# Patient Record
Sex: Female | Born: 1970 | Race: Black or African American | Hispanic: No | Marital: Single | Smoking: Never smoker
Health system: Southern US, Community
[De-identification: ages and names within clinical notes are randomized; demographics above are authoritative.]

## PROBLEM LIST (undated history)

## (undated) DIAGNOSIS — J302 Other seasonal allergic rhinitis: Secondary | ICD-10-CM

## (undated) DIAGNOSIS — J45909 Unspecified asthma, uncomplicated: Secondary | ICD-10-CM

## (undated) HISTORY — DX: Other seasonal allergic rhinitis: J30.2

---

## 1997-11-23 ENCOUNTER — Emergency Department (HOSPITAL_COMMUNITY): Admission: EM | Admit: 1997-11-23 | Discharge: 1997-11-23 | Payer: Self-pay | Admitting: Emergency Medicine

## 1998-07-08 ENCOUNTER — Emergency Department (HOSPITAL_COMMUNITY): Admission: EM | Admit: 1998-07-08 | Discharge: 1998-07-09 | Payer: Self-pay | Admitting: Emergency Medicine

## 1998-07-09 ENCOUNTER — Encounter: Payer: Self-pay | Admitting: Emergency Medicine

## 1998-11-04 ENCOUNTER — Emergency Department (HOSPITAL_COMMUNITY): Admission: EM | Admit: 1998-11-04 | Discharge: 1998-11-04 | Payer: Self-pay | Admitting: Emergency Medicine

## 1999-08-10 ENCOUNTER — Emergency Department (HOSPITAL_COMMUNITY): Admission: EM | Admit: 1999-08-10 | Discharge: 1999-08-10 | Payer: Self-pay

## 1999-10-07 ENCOUNTER — Emergency Department (HOSPITAL_COMMUNITY): Admission: EM | Admit: 1999-10-07 | Discharge: 1999-10-07 | Payer: Self-pay | Admitting: Emergency Medicine

## 2000-03-29 ENCOUNTER — Emergency Department (HOSPITAL_COMMUNITY): Admission: EM | Admit: 2000-03-29 | Discharge: 2000-03-29 | Payer: Self-pay | Admitting: *Deleted

## 2002-03-15 ENCOUNTER — Other Ambulatory Visit: Admission: RE | Admit: 2002-03-15 | Discharge: 2002-03-15 | Payer: Self-pay | Admitting: *Deleted

## 2003-04-03 ENCOUNTER — Inpatient Hospital Stay (HOSPITAL_COMMUNITY): Admission: AD | Admit: 2003-04-03 | Discharge: 2003-04-04 | Payer: Self-pay | Admitting: *Deleted

## 2003-04-04 ENCOUNTER — Encounter: Payer: Self-pay | Admitting: *Deleted

## 2003-06-22 ENCOUNTER — Other Ambulatory Visit: Admission: RE | Admit: 2003-06-22 | Discharge: 2003-06-22 | Payer: Self-pay | Admitting: Obstetrics and Gynecology

## 2003-10-19 ENCOUNTER — Emergency Department (HOSPITAL_COMMUNITY): Admission: EM | Admit: 2003-10-19 | Discharge: 2003-10-19 | Payer: Self-pay | Admitting: Emergency Medicine

## 2004-01-29 ENCOUNTER — Inpatient Hospital Stay (HOSPITAL_COMMUNITY): Admission: AD | Admit: 2004-01-29 | Discharge: 2004-01-29 | Payer: Self-pay | Admitting: *Deleted

## 2008-02-12 ENCOUNTER — Emergency Department (HOSPITAL_COMMUNITY): Admission: EM | Admit: 2008-02-12 | Discharge: 2008-02-12 | Payer: Self-pay | Admitting: Family Medicine

## 2008-05-01 ENCOUNTER — Encounter: Admission: RE | Admit: 2008-05-01 | Discharge: 2008-05-01 | Payer: Self-pay | Admitting: Allergy and Immunology

## 2008-12-31 ENCOUNTER — Emergency Department (HOSPITAL_BASED_OUTPATIENT_CLINIC_OR_DEPARTMENT_OTHER): Admission: EM | Admit: 2008-12-31 | Discharge: 2008-12-31 | Payer: Self-pay | Admitting: Emergency Medicine

## 2010-08-03 ENCOUNTER — Encounter: Payer: Self-pay | Admitting: Specialist

## 2010-10-20 LAB — POCT CARDIAC MARKERS
CKMB, poc: <1 ng/mL — ABNORMAL LOW (ref 1.0–8.0)
Myoglobin, poc: 32.9 ng/mL (ref 12–200)
Troponin i, poc: <0.05 ng/mL (ref 0.00–0.09)

## 2010-10-20 LAB — DIFFERENTIAL
Eosinophils Relative: 2 % (ref 0–5)
Lymphocytes Relative: 43 % (ref 12–46)
Lymphs Abs: 2.3 10*3/uL (ref 0.7–4.0)
Neutrophils Relative %: 48 % (ref 43–77)

## 2010-10-20 LAB — PREGNANCY, URINE: Preg Test, Ur: NEGATIVE

## 2010-10-20 LAB — CBC
Hemoglobin: 11.8 g/dL — ABNORMAL LOW (ref 12.0–15.0)
MCHC: 33.3 g/dL (ref 30.0–36.0)
RDW: 13.1 % (ref 11.5–15.5)
WBC: 5.3 10*3/uL (ref 4.0–10.5)

## 2010-10-20 LAB — BASIC METABOLIC PANEL
CO2: 25 mEq/L (ref 19–32)
GFR calc Af Amer: >60 mL/min (ref >60)
Glucose, Bld: 104 mg/dL — ABNORMAL HIGH (ref 70–99)

## 2015-03-30 ENCOUNTER — Encounter (HOSPITAL_COMMUNITY): Payer: Self-pay | Admitting: Emergency Medicine

## 2015-03-30 ENCOUNTER — Emergency Department (HOSPITAL_COMMUNITY)
Admission: EM | Admit: 2015-03-30 | Discharge: 2015-03-30 | Disposition: A | Discharge status: Home / Self Care | Payer: BLUE CROSS/BLUE SHIELD | Attending: Emergency Medicine | Admitting: Emergency Medicine

## 2015-03-30 ENCOUNTER — Emergency Department (HOSPITAL_COMMUNITY): Payer: BLUE CROSS/BLUE SHIELD

## 2015-03-30 DIAGNOSIS — R059 Cough, unspecified: Secondary | ICD-10-CM

## 2015-03-30 DIAGNOSIS — R05 Cough: Secondary | ICD-10-CM | POA: Diagnosis present

## 2015-03-30 DIAGNOSIS — J45909 Unspecified asthma, uncomplicated: Secondary | ICD-10-CM | POA: Diagnosis not present

## 2015-03-30 DIAGNOSIS — J069 Acute upper respiratory infection, unspecified: Secondary | ICD-10-CM | POA: Diagnosis not present

## 2015-03-30 DIAGNOSIS — J0100 Acute maxillary sinusitis, unspecified: Secondary | ICD-10-CM | POA: Insufficient documentation

## 2015-03-30 HISTORY — DX: Unspecified asthma, uncomplicated: J45.909

## 2015-03-30 MED ORDER — AZITHROMYCIN 250 MG PO TABS: 250.0000 mg | ORAL_TABLET | Freq: Every day | ORAL | Status: DC

## 2015-03-30 MED ORDER — BENZONATATE 100 MG PO CAPS: 100.0000 mg | ORAL_CAPSULE | Freq: Three times a day (TID) | ORAL | Status: DC

## 2015-03-30 MED ORDER — FLUCONAZOLE 150 MG PO TABS: 150.0000 mg | ORAL_TABLET | Freq: Every day | ORAL | Status: DC

## 2015-03-30 NOTE — ED Provider Notes (Signed)
CSN: [id number]     Arrival date & time 03/30/15  1531 History   This chart was scribed for non-physician practitioner, Santiago Glad, PA-C, working with Gilda Crease, MD by Freida Busman, ED Scribe. This patient was seen in room WTR5/WTR5 and the patient's care was started at 5:25 PM.    Chief Complaint  Patient presents with  . URI   The history is provided by the patient. No language interpreter was used.     HPI Comments:  Tamara Bowers is a 44 y.o. female with a history of asthma and PNA, who presents to the Emergency Department complaining of cough for 1 day. She reports associated congestion for 3 days, facial pain with positional changes, and mild dizziness.  Symptoms gradually worsening.  She has not had to used a rescue inhaler but has been using her daily inhaler. She has been taking left over doxycycline and tylenol without improvement. No alleviating factors noted. She denies fever or chills.     Past Medical History  Diagnosis Date  . Asthma    History reviewed. No pertinent past surgical history. No family history on file. Social History  Substance Use Topics  . Smoking status: Never Smoker   . Smokeless tobacco: None  . Alcohol Use: Yes   OB History    No data available     Review of Systems  All other systems reviewed and are negative.     Allergies  Review of patient's allergies indicates not on file.  Home Medications   Prior to Admission medications   Not on File   BP 154/72 mmHg  Pulse 83  Temp(Src) 98.6 F (37 C) (Oral)  Resp 18  SpO2 100% Physical Exam  Constitutional: She is oriented to person, place, and time. She appears well-developed and well-nourished. No distress.  HENT:  Head: Normocephalic and atraumatic.  Right Ear: Tympanic membrane, external ear and ear canal normal.  Left Ear: Tympanic membrane, external ear and ear canal normal.  Maxillary TTP    Eyes: Conjunctivae are normal.  Neck: Normal range of motion.   Cardiovascular: Normal rate, regular rhythm and normal heart sounds.   Pulmonary/Chest: Effort normal and breath sounds normal. No respiratory distress. She has no wheezes. She has no rales.  Musculoskeletal: Normal range of motion.  Neurological: She is alert and oriented to person, place, and time.  Skin: Skin is warm and dry.  Nursing note and vitals reviewed.   ED Course  Procedures   DIAGNOSTIC STUDIES:  Oxygen Saturation is 100% on RA, normal by my interpretation.    COORDINATION OF CARE:  5:30 PM Discussed treatment plan with pt at bedside and pt agreed to plan.  Labs Review Labs Reviewed - No data to display  Imaging Review Dg Chest 2 View  03/30/2015   CLINICAL DATA:  Headache for 3 days, productive cough  EXAM: CHEST  2 VIEW  COMPARISON:  05/01/2008  FINDINGS: The heart size and mediastinal contours are within normal limits. Both lungs are clear. The visualized skeletal structures are unremarkable.  IMPRESSION: No active cardiopulmonary disease.   Electronically Signed   By: Natasha Mead M.D.   On: 03/30/2015 17:43   I have personally reviewed and evaluated these images and lab results as part of my medical decision-making.   EKG Interpretation None      MDM   Final diagnoses:  Cough  URI (upper respiratory infection)  Acute maxillary sinusitis, recurrence not specified  Pt CXR negative for acute infiltrate.  Patients symptoms are consistent with URI, likely viral etiology. Patient also with sinus pain consistent with Acute Sinusitis.  Patient told that that sinusitis is usually viral, but given antibiotic if symptoms persist after one week.   Verbalizes understanding and is agreeable with plan. Pt is hemodynamically stable & in NAD prior to dc.  I personally performed the services described in this documentation, which was scribed in my presence. The recorded information has been reviewed and is accurate.    Santiago Glad, PA-C 03/30/15 2026  Gilda Crease, MD 03/31/15 [phone]

## 2015-03-30 NOTE — ED Notes (Signed)
Bed: [facility] Expected date:  Expected time:  Means of arrival:  Comments: 

## 2015-03-30 NOTE — ED Notes (Signed)
Per pt, states congestion and cold symptoms since thursday

## 2015-10-07 ENCOUNTER — Emergency Department (HOSPITAL_COMMUNITY)
Admission: EM | Admit: 2015-10-07 | Discharge: 2015-10-07 | Disposition: A | Discharge status: Home / Self Care | Payer: BLUE CROSS/BLUE SHIELD | Attending: Emergency Medicine | Admitting: Emergency Medicine

## 2015-10-07 ENCOUNTER — Encounter (HOSPITAL_COMMUNITY): Payer: Self-pay | Admitting: *Deleted

## 2015-10-07 DIAGNOSIS — J45909 Unspecified asthma, uncomplicated: Secondary | ICD-10-CM | POA: Insufficient documentation

## 2015-10-07 DIAGNOSIS — M25511 Pain in right shoulder: Secondary | ICD-10-CM | POA: Insufficient documentation

## 2015-10-07 DIAGNOSIS — Z79899 Other long term (current) drug therapy: Secondary | ICD-10-CM | POA: Diagnosis not present

## 2015-10-07 MED ORDER — KETOROLAC TROMETHAMINE 30 MG/ML IJ SOLN
30.0000 mg | Freq: Once | INTRAMUSCULAR | Status: AC
  Administered 2015-10-07: 30 mg via INTRAMUSCULAR
  Filled 2015-10-07: qty 1

## 2015-10-07 MED ORDER — NAPROXEN 500 MG PO TABS: 500.0000 mg | ORAL_TABLET | Freq: Two times a day (BID) | ORAL | Status: DC

## 2015-10-07 NOTE — Discharge Instructions (Signed)
Shoulder Pain  The shoulder is the joint that connects your arms to your body. The bones that form the shoulder joint include the upper arm bone (humerus), the shoulder blade (scapula), and the collarbone (clavicle). The top of the humerus is shaped like a ball and fits into a rather flat socket on the scapula (glenoid cavity). A combination of muscles and strong, fibrous tissues that connect muscles to bones (tendons) support your shoulder joint and hold the ball in the socket. Small, fluid-filled sacs (bursae) are located in different areas of the joint. They act as cushions between the bones and the overlying soft tissues and help reduce friction between the gliding tendons and the bone as you move your arm. Your shoulder joint allows a wide range of motion in your arm. This range of motion allows you to do things like scratch your back or throw a ball. However, this range of motion also makes your shoulder more prone to pain from overuse and injury.  Causes of shoulder pain can originate from both injury and overuse and usually can be grouped in the following four categories:  1. Redness, swelling, and pain (inflammation) of the tendon (tendinitis) or the bursae (bursitis).  2. Instability, such as a dislocation of the joint.  3. Inflammation of the joint (arthritis).  4. Broken bone (fracture).  HOME CARE INSTRUCTIONS   1. Apply ice to the sore area.  ¨ Put ice in a plastic bag.  ¨ Place a towel between your skin and the bag.  ¨ Leave the ice on for 15-20 minutes, 3-4 times per day for the first 2 days, or as directed by your health care provider.  2. Stop using cold packs if they do not help with the pain.  3. If you have a shoulder sling or immobilizer, wear it as long as your caregiver instructs. Only remove it to shower or bathe. Move your arm as little as possible, but keep your hand moving to prevent swelling.  4. Squeeze a soft ball or foam pad as much as possible to help prevent swelling.  5. Only take  over-the-counter or prescription medicines for pain, discomfort, or fever as directed by your caregiver.  SEEK MEDICAL CARE IF:   1. Your shoulder pain increases, or new pain develops in your arm, hand, or fingers.  2. Your hand or fingers become cold and numb.  3. Your pain is not relieved with medicines.  SEEK IMMEDIATE MEDICAL CARE IF:   1. Your arm, hand, or fingers are numb or tingling.  2. Your arm, hand, or fingers are significantly swollen or turn white or blue.  MAKE SURE YOU:   1. Understand these instructions.  2. Will watch your condition.  3. Will get help right away if you are not doing well or get worse.     This information is not intended to replace advice given to you by your health care provider. Make sure you discuss any questions you have with your health care provider.     Document Released: 04/08/2005 Document Revised: 07/20/2014 Document Reviewed: 10/22/2014  Elsevier Interactive Patient Education ©2016 [employer]  Shoulder Range of Motion Exercises  Shoulder range of motion (ROM) exercises are designed to keep the shoulder moving freely. They are often recommended for people who have shoulder pain.  MOVEMENT EXERCISE  When you are able, do this exercise 5-6 days per week, or as told by your health care provider. Work toward doing 2 sets of 10 swings.  Pendulum   Exercise  How To Do This Exercise Lying Down  5. Lie face-down on a bed with your abdomen close to the side of the bed.  6. Let your arm hang over the side of the bed.  7. Relax your shoulder, arm, and hand.  8. Slowly and gently swing your arm forward and back. Do not use your neck muscles to swing your arm. They should be relaxed. If you are struggling to swing your arm, have someone gently swing it for you. When you do this exercise for the first time, swing your arm at a 15 degree angle for 15 seconds, or swing your arm 10 times. As pain lessens over time, increase the angle of the swing to 30-45 degrees.  9. Repeat steps 1-4  with the other arm.  How To Do This Exercise While Standing  6. Stand next to a sturdy chair or table and hold on to it with your hand.  ¨ Bend forward at the waist.  ¨ Bend your knees slightly.  ¨ Relax your other arm and let it hang limp.  ¨ Relax the shoulder blade of the arm that is hanging and let it drop.  ¨ While keeping your shoulder relaxed, use body motion to swing your arm in small circles. The first time you do this exercise, swing your arm for about 30 seconds or 10 times. When you do it next time, swing your arm for a little longer.  ¨ Stand up tall and relax.  ¨ Repeat steps 1-7, this time changing the direction of the circles.  7. Repeat steps 1-8 with the other arm.  STRETCHING EXERCISES  Do these exercises 3-4 times per day on 5-6 days per week or as told by your health care provider. Work toward holding the stretch for 20 seconds.  Stretching Exercise 1  4. Lift your arm straight out in front of you.  5. Bend your arm 90 degrees at the elbow (right angle) so your forearm goes across your body and looks like the letter "L."  6. Use your other arm to gently pull the elbow forward and across your body.  7. Repeat steps 1-3 with the other arm.  Stretching Exercise 2  You will need a towel or rope for this exercise.  3. Bend one arm behind your back with the palm facing outward.  4. Hold a towel with your other hand.  5. Reach the arm that holds the towel above your head, and bend that arm at the elbow. Your wrist should be behind your neck.  6. Use your free hand to grab the free end of the towel.  7. With the higher hand, gently pull the towel up behind you.  8. With the lower hand, pull the towel down behind you.  9. Repeat steps 1-6 with the other arm.  STRENGTHENING EXERCISES  Do each of these exercises at four different times of day (sessions) every day or as told by your health care provider. To begin with, repeat each exercise 5 times (repetitions). Work toward doing 3 sets of 12 repetitions or  as told by your health care provider.  Strengthening Exercise 1  You will need a light weight for this activity. As you grow stronger, you may use a heavier weight.  4. Standing with a weight in your hand, lift your arm straight out to the side until it is at the same height as your shoulder.  5. Bend your arm at 90 degrees so that your   fingers are pointing to the ceiling.  6. Slowly raise your hand until your arm is straight up in the air.  7. Repeat steps 1-3 with the other arm.  Strengthening Exercise 2  You will need a light weight for this activity. As you grow stronger, you may use a heavier weight.  1. Standing with a weight in your hand, gradually move your straight arm in an arc, starting at your side, then out in front of you, then straight up over your head.  2. Gradually move your other arm in an arc, starting at your side, then out in front of you, then straight up over your head.  3. Repeat steps 1-2 with the other arm.  Strengthening Exercise 3  You will need an elastic band for this activity. As you grow stronger, gradually increase the size of the bands or increase the number of bands that you use at one time.  1. While standing, hold an elastic band in one hand and raise that arm up in the air.  2. With your other hand, pull down the band until that hand is by your side.  3. Repeat steps 1-2 with the other arm.     This information is not intended to replace advice given to you by your health care provider. Make sure you discuss any questions you have with your health care provider.     Document Released: 03/28/2003 Document Revised: 11/13/2014 Document Reviewed: 06/25/2014  Elsevier Interactive Patient Education ©2016 [employer]

## 2015-10-07 NOTE — ED Notes (Signed)
PT reports Rt shoulder pain that is worse . Pt was seen and treated 2 times at [facility]. ON Sunday PT received a steroid injection into the RT anterior shoulder. Pt is also wearing a Wrist splint  for carpel tunnel of RT wrist. PT is taking Meloxicam and pergogesic . No relief of pain.

## 2015-10-07 NOTE — ED Provider Notes (Signed)
CSN: [id number]     Arrival date & time 10/07/15  [id number] History   First MD Initiated Contact with Patient 10/07/15 [phone]     Chief Complaint  Patient presents with  . Shoulder Pain    Tamara Bowers is a [age] y.o. female who presents to the emergency department complaining of continued right shoulder pain today. Patient reports she's been having intermittent right shoulder pain for the past several weeks that worsened the past several days. She reports having some associated right wrist pain as well. Patient was seen at urgent care 3 days ago and was diagnosed with carpal tunnel and right shoulder pain. She received an x-ray of her right shoulder that she reports is unremarkable. She is provided with a wrist splint and meloxicam. She is not been taking the meloxicam. She reports her wrist pain has improved but she developed worsening right shoulder pain over the weekend. She was seen in urgent care again yesterday and was provided with steroid injection to her right shoulder. She reports immediate relief with this shoulder injection. She reports this morning she woke up with worsening pain. She has taken nothing for treatment of her symptoms today. She reports her pain is worse with movement. She denies any known injury to her right shoulder. She denies fevers, numbness, tingling, weakness or known injury.  Patient is a 45 y.o. female presenting with shoulder pain. The history is provided by the patient. No language interpreter was used.  Shoulder Pain Associated symptoms: no fever and no neck pain     Past Medical History  Diagnosis Date  . Asthma    History reviewed. No pertinent past surgical history. History reviewed. No pertinent family history. Social History  Substance Use Topics  . Smoking status: Never Smoker   . Smokeless tobacco: None  . Alcohol Use: Yes   OB History    No data available     Review of Systems  Constitutional: Negative for fever.  Musculoskeletal: Positive for  arthralgias. Negative for joint swelling, neck pain and neck stiffness.  Skin: Negative for color change, rash and wound.  Neurological: Negative for weakness and numbness.      Allergies  Other  Home Medications   Prior to Admission medications   Medication Sig Start Date End Date Taking? Authorizing Provider  azithromycin (ZITHROMAX) 250 MG tablet Take 1 tablet (250 mg total) by mouth daily. Take first 2 tablets together, then 1 every day until finished. 03/30/15  Yes Heather Laisure, PA-C  benzonatate (TESSALON) 100 MG capsule Take 1 capsule (100 mg total) by mouth every 8 (eight) hours. 03/30/15  Yes Heather Laisure, PA-C  fluconazole (DIFLUCAN) 150 MG tablet Take 1 tablet (150 mg total) by mouth daily. 03/30/15  Yes Heather Laisure, PA-C  naproxen (NAPROSYN) 500 MG tablet Take 1 tablet (500 mg total) by mouth 2 (two) times daily with a meal. 10/07/15   Everlene FarrierWilliam Aurelie Dicenzo, PA-C   BP 117/80 mmHg  Pulse 79  Temp(Src) 98 F (36.7 C)  Resp 18  Ht 5\' 3"  (1.6 m)  Wt 58.968 kg  BMI 23.03 kg/m2  SpO2 99% Physical Exam  Constitutional: She appears well-developed and well-nourished. No distress.  Nontoxic appearing.  HENT:  Head: Normocephalic and atraumatic.  Eyes: Right eye exhibits no discharge. Left eye exhibits no discharge.  Neck: Normal range of motion. Neck supple.  Cardiovascular: Normal rate, regular rhythm and intact distal pulses.   Bilateral radial pulses are 2+. Good capillary refill to her right distal fingertips.  Pulmonary/Chest: Effort normal. No respiratory distress.  Musculoskeletal: Normal range of motion. She exhibits tenderness. She exhibits no edema.  Patient has pain with active range of motion of her right shoulder. No bony point tenderness of her right shoulder. No deformity to her right shoulder. No right shorter erythema, ecchymosis, edema or warmth. Signs of infection. No right elbow tenderness to palpation. Good range of motion of her right elbow. No arm edema.   Neurological: She is alert. Coordination normal.  Sensation is intact to her bilateral upper extremities.  Skin: Skin is warm and dry. No rash noted. She is not diaphoretic. No erythema. No pallor.  Psychiatric: She has a normal mood and affect. Her behavior is normal.  Nursing note and vitals reviewed.   ED Course  Procedures (including critical care time) Labs Review Labs Reviewed - No data to display  Imaging Review No results found.    EKG Interpretation None      Filed Vitals:   10/07/15 0834 10/07/15 0849  BP: 0/0 117/80  Pulse: 79   Temp: 98 F (36.7 C)   Resp: 18   Height:  (1.6 m)   Weight: 58.968 kg   SpO2: 99%      MDM   Meds given in ED:  Medications  ketorolac (TORADOL) 30 MG/ML injection 30 mg (30 mg Intramuscular Given 10/07/15 0851)    New Prescriptions   NAPROXEN (NAPROSYN) 500 MG TABLET    Take 1 tablet (500 mg total) by mouth 2 (two) times daily with a meal.    Final diagnoses:  Right shoulder pain   This  is a 45 y.o. female who presents to the emergency department complaining of continued right shoulder pain today. Patient reports she's been having intermittent right shoulder pain for the past several weeks that worsened the past several days. She reports having some associated right wrist pain as well. Patient was seen at urgent care 3 days ago and was diagnosed with carpal tunnel and right shoulder pain. She received an x-ray of her right shoulder that she reports is unremarkable. She was provided with a wrist splint and meloxicam. She has not been taking the meloxicam. She reports her wrist pain has improved but she developed worsening right shoulder pain over the weekend. She was seen in urgent care again yesterday and was provided with steroid injection to her right shoulder. She reports immediate relief with this shoulder injection. She reports this morning she woke up with worsening pain. She has taken nothing for treatment of her  symptoms today. She reports her pain is worse with movement. She denies any known injury to her right shoulder.  On exam the patient is afebrile nontoxic appearing. Patient has pain with range of motion of her right shoulder. No right shoulder deformity, bony point tenderness, deformity, ecchymosis or erythema. No right shoulder warmth. No sign of infection. She is neurovascularly intact. I see no need for repeat x-rays as the patient has no known injury. Urged patient to continue taking pain medicine. She feels the meloxicam is not working. I advised her to discontinue taking meloxicam and start naproxen. Will provide with Toradol injection in the emergency department. I encouraged her to follow-up with orthopedic surgeon Dr. Shon Baton for further evaluation of her right shoulder pain. I advised the patient to follow-up with their primary care provider this week. I advised the patient to return to the emergency department with new or worsening symptoms or new concerns. The patient verbalized understanding and agreement with  plan.       [name], PA-C 10/07/15 [id number]  Arby Barrette, MD 10/09/15 [phone]

## 2015-10-07 NOTE — ED Notes (Signed)
Declined W/C at D/C and was escorted to lobby by RN. 

## 2016-06-22 ENCOUNTER — Encounter (HOSPITAL_COMMUNITY): Payer: Self-pay | Admitting: *Deleted

## 2016-06-22 ENCOUNTER — Ambulatory Visit (HOSPITAL_COMMUNITY)
Admission: EM | Admit: 2016-06-22 | Discharge: 2016-06-22 | Disposition: A | Discharge status: Home / Self Care | Payer: BLUE CROSS/BLUE SHIELD | Attending: Family Medicine | Admitting: Family Medicine

## 2016-06-22 DIAGNOSIS — S161XXA Strain of muscle, fascia and tendon at neck level, initial encounter: Secondary | ICD-10-CM | POA: Diagnosis not present

## 2016-06-22 MED ORDER — CYCLOBENZAPRINE HCL 5 MG PO TABS: 5.0000 mg | ORAL_TABLET | Freq: Every day | ORAL | 0 refills | Status: DC

## 2016-06-22 MED ORDER — PREDNISONE 20 MG PO TABS: ORAL_TABLET | ORAL | 0 refills | Status: DC
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
## 2016-06-22 NOTE — Discharge Instructions (Signed)
Expected neck soreness to gradually disappear over the next 3-5 days.
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
## 2016-06-27 ENCOUNTER — Encounter (HOSPITAL_BASED_OUTPATIENT_CLINIC_OR_DEPARTMENT_OTHER): Payer: Self-pay | Admitting: *Deleted

## 2016-06-27 ENCOUNTER — Emergency Department (HOSPITAL_BASED_OUTPATIENT_CLINIC_OR_DEPARTMENT_OTHER)
Admission: EM | Admit: 2016-06-27 | Discharge: 2016-06-27 | Disposition: A | Discharge status: Home / Self Care | Payer: BLUE CROSS/BLUE SHIELD | Attending: Emergency Medicine | Admitting: Emergency Medicine

## 2016-06-27 DIAGNOSIS — Z79899 Other long term (current) drug therapy: Secondary | ICD-10-CM | POA: Diagnosis not present

## 2016-06-27 DIAGNOSIS — N12 Tubulo-interstitial nephritis, not specified as acute or chronic: Secondary | ICD-10-CM | POA: Insufficient documentation

## 2016-06-27 DIAGNOSIS — J45909 Unspecified asthma, uncomplicated: Secondary | ICD-10-CM | POA: Diagnosis not present

## 2016-06-27 DIAGNOSIS — E876 Hypokalemia: Secondary | ICD-10-CM | POA: Insufficient documentation

## 2016-06-27 DIAGNOSIS — R1030 Lower abdominal pain, unspecified: Secondary | ICD-10-CM | POA: Diagnosis present

## 2016-06-27 LAB — COMPREHENSIVE METABOLIC PANEL
ALK PHOS: 33 U/L — AB (ref 38–126)
ALT: 16 U/L (ref 14–54)
ANION GAP: 10 (ref 5–15)
AST: 20 U/L (ref 15–41)
Albumin: 3.6 g/dL (ref 3.5–5.0)
BUN: 16 mg/dL (ref 6–20)
CALCIUM: 8.4 mg/dL — AB (ref 8.9–10.3)
CO2: 24 mmol/L (ref 22–32)
Chloride: 101 mmol/L (ref 101–111)
Creatinine, Ser: 1.16 mg/dL — ABNORMAL HIGH (ref 0.44–1.00)
GFR calc non Af Amer: 57 mL/min — ABNORMAL LOW (ref >60)
Glucose, Bld: 98 mg/dL (ref 65–99)
Potassium: 2.7 mmol/L — CL (ref 3.5–5.1)
SODIUM: 135 mmol/L (ref 135–145)
TOTAL PROTEIN: 6.2 g/dL — AB (ref 6.5–8.1)
Total Bilirubin: 0.7 mg/dL (ref 0.3–1.2)

## 2016-06-27 LAB — URINALYSIS, ROUTINE W REFLEX MICROSCOPIC
BILIRUBIN URINE: NEGATIVE
GLUCOSE, UA: NEGATIVE mg/dL
KETONES UR: NEGATIVE mg/dL
Nitrite: POSITIVE — AB
PROTEIN: NEGATIVE mg/dL
Specific Gravity, Urine: 1.009 (ref 1.005–1.030)
pH: 5.5 (ref 5.0–8.0)

## 2016-06-27 LAB — URINALYSIS, MICROSCOPIC (REFLEX)

## 2016-06-27 LAB — CBC WITH DIFFERENTIAL/PLATELET
Basophils Absolute: 0 10*3/uL (ref 0.0–0.1)
Basophils Relative: 0 %
EOS ABS: 0 10*3/uL (ref 0.0–0.7)
EOS PCT: 0 %
HCT: 34 % — ABNORMAL LOW (ref 36.0–46.0)
Hemoglobin: 11.3 g/dL — ABNORMAL LOW (ref 12.0–15.0)
LYMPHS ABS: 1.2 10*3/uL (ref 0.7–4.0)
LYMPHS PCT: 8 %
MCH: 26.7 pg (ref 26.0–34.0)
MCHC: 33.2 g/dL (ref 30.0–36.0)
MCV: 80.2 fL (ref 78.0–100.0)
MONO ABS: 0.9 10*3/uL (ref 0.1–1.0)
MONOS PCT: 6 %
Neutro Abs: 12.9 10*3/uL — ABNORMAL HIGH (ref 1.7–7.7)
Neutrophils Relative %: 86 %
PLATELETS: 269 10*3/uL (ref 150–400)
RBC: 4.24 MIL/uL (ref 3.87–5.11)
RDW: 13.6 % (ref 11.5–15.5)
WBC: 15.1 10*3/uL — AB (ref 4.0–10.5)

## 2016-06-27 LAB — I-STAT CG4 LACTIC ACID, ED: Lactic Acid, Venous: 1.31 mmol/L (ref 0.5–1.9)

## 2016-06-27 LAB — PREGNANCY, URINE: PREG TEST UR: NEGATIVE

## 2016-06-27 MED ORDER — FLUCONAZOLE 150 MG PO TABS: 150.0000 mg | ORAL_TABLET | Freq: Every day | ORAL | 0 refills | Status: DC

## 2016-06-27 MED ORDER — CEPHALEXIN 500 MG PO CAPS: 500.0000 mg | ORAL_CAPSULE | Freq: Two times a day (BID) | ORAL | 0 refills | Status: DC

## 2016-06-27 MED ORDER — POTASSIUM CHLORIDE CRYS ER 20 MEQ PO TBCR
40.0000 meq | EXTENDED_RELEASE_TABLET | Freq: Once | ORAL | Status: AC
Start: 2016-06-27 — End: 2016-06-27
  Administered 2016-06-27: 40 meq via ORAL
  Filled 2016-06-27: qty 2

## 2016-06-27 MED ORDER — KETOROLAC TROMETHAMINE 15 MG/ML IJ SOLN
15.0000 mg | Freq: Once | INTRAMUSCULAR | Status: AC
  Administered 2016-06-27: 15 mg via INTRAVENOUS
  Filled 2016-06-27: qty 1

## 2016-06-27 MED ORDER — ONDANSETRON 8 MG PO TBDP: 8.0000 mg | ORAL_TABLET | Freq: Three times a day (TID) | ORAL | 0 refills | Status: DC | PRN

## 2016-06-27 MED ORDER — ONDANSETRON 4 MG PO TBDP
4.0000 mg | ORAL_TABLET | Freq: Once | ORAL | Status: DC
  Filled 2016-06-27: qty 1

## 2016-06-27 MED ORDER — DEXTROSE 5 % IV SOLN
1.0000 g | Freq: Once | INTRAVENOUS | Status: AC
  Administered 2016-06-27: 1 g via INTRAVENOUS
  Filled 2016-06-27: qty 10

## 2016-06-27 MED ORDER — ACETAMINOPHEN 325 MG PO TABS
650.0000 mg | ORAL_TABLET | Freq: Once | ORAL | Status: AC
  Administered 2016-06-27: 650 mg via ORAL
  Filled 2016-06-27: qty 2

## 2016-06-27 MED ORDER — ONDANSETRON 4 MG PO TBDP
4.0000 mg | ORAL_TABLET | Freq: Once | ORAL | Status: AC
  Administered 2016-06-27: 4 mg via ORAL
  Filled 2016-06-27: qty 1

## 2016-06-27 MED ORDER — FLUCONAZOLE 50 MG PO TABS: 150.0000 mg | ORAL_TABLET | Freq: Once | ORAL | Status: DC

## 2016-06-27 MED ORDER — SALINE SPRAY 0.65 % NA SOLN
1.0000 | Freq: Once | NASAL | Status: AC
  Administered 2016-06-27: 1 via NASAL
  Filled 2016-06-27: qty 44

## 2016-06-27 MED ORDER — POTASSIUM CHLORIDE CRYS ER 20 MEQ PO TBCR
60.0000 meq | EXTENDED_RELEASE_TABLET | Freq: Once | ORAL | Status: AC
  Administered 2016-06-27: 60 meq via ORAL
  Filled 2016-06-27: qty 3

## 2016-06-27 MED ORDER — ACETAMINOPHEN 325 MG PO TABS: 650.0000 mg | ORAL_TABLET | Freq: Once | ORAL | Status: DC

## 2016-06-27 MED ORDER — SODIUM CHLORIDE 0.9 % IV BOLUS (SEPSIS)
1000.0000 mL | Freq: Once | INTRAVENOUS | Status: AC
  Administered 2016-06-27: 1000 mL via INTRAVENOUS

## 2016-06-27 NOTE — ED Provider Notes (Signed)
[facility] Provider Note   CSN: [id number] Arrival date & time: 06/27/16  1420     History   Chief Complaint Chief Complaint  Patient presents with  . Abdominal Pain    HPI Tamara Bowers is a 45 y.o. female.  HPI  Pt comes in with cc of weakness, abd pain. Pt reports that she has some suprapubic abd tenderness and burning with urination. Her symptoms started 2 days ago. Today, she started having chills and nausea. She started feeling really weak, so she came to the ER. Pt has no vaginal discharge, bleeding and has no hx of STD or concerns for STD.   Past Medical History:  Diagnosis Date  . Asthma     There are no active problems to display for this patient.   History reviewed. No pertinent surgical history.  OB History    No data available       Home Medications    Prior to Admission medications   Medication Sig Start Date End Date Taking? Authorizing Provider  mometasone (NASONEX) 50 MCG/ACT nasal spray Place 2 sprays into the nose daily.   Yes Historical Provider, MD  Montelukast Sodium (SINGULAIR PO) Take by mouth.   Yes Historical Provider, MD  cyclobenzaprine (FLEXERIL) 5 MG tablet Take 1 tablet (5 mg total) by mouth at bedtime. 06/22/16   Elvina SidleKurt Lauenstein, MD  naproxen (NAPROSYN) 500 MG tablet Take 1 tablet (500 mg total) by mouth 2 (two) times daily with a meal. 10/07/15   Everlene FarrierWilliam Dansie, PA-C  predniSONE (DELTASONE) 20 MG tablet Two daily with food 06/22/16   Elvina SidleKurt Lauenstein, MD    Family History No family history on file.  Social History Social History  Substance Use Topics  . Smoking status: Never Smoker  . Smokeless tobacco: Never Used  . Alcohol use Yes     Allergies   Other   Review of Systems Review of Systems  ROS 10 Systems reviewed and are negative for acute change except as noted in the HPI.     Physical Exam Updated Vital Signs BP 106/65 (BP Location: Right Arm)   Pulse 100   Temp 99 F (37.2 C)  (Oral)   Resp 18   Ht 5\' 4"  (1.626 m)   Wt 165 lb (74.8 kg)   SpO2 98%   BMI 28.32 kg/m   Physical Exam  Constitutional: She is oriented to person, place, and time. She appears well-developed and well-nourished.  HENT:  Head: Normocephalic and atraumatic.  Eyes: EOM are normal. Pupils are equal, round, and reactive to light.  Neck: Neck supple.  Cardiovascular: Normal rate, regular rhythm and normal heart sounds.   No murmur heard. Pulmonary/Chest: Effort normal. No respiratory distress.  Abdominal: Soft. She exhibits no distension. There is no tenderness. There is no rebound and no guarding.  Neurological: She is alert and oriented to person, place, and time.  Skin: Skin is warm and dry.  Nursing note and vitals reviewed.    ED Treatments / Results  Labs (all labs ordered are listed, but only abnormal results are displayed) Labs Reviewed  URINALYSIS, ROUTINE W REFLEX MICROSCOPIC - Abnormal; Notable for the following:       Result Value   APPearance CLOUDY (*)    Hgb urine dipstick MODERATE (*)    Nitrite POSITIVE (*)    Leukocytes, UA LARGE (*)    All other components within normal limits  COMPREHENSIVE METABOLIC PANEL - Abnormal; Notable for the following:    Potassium  2.7 (*)    Creatinine, Ser 1.16 (*)    Calcium 8.4 (*)    Total Protein 6.2 (*)    Alkaline Phosphatase 33 (*)    GFR calc non Af Amer 57 (*)    All other components within normal limits  CBC WITH DIFFERENTIAL/PLATELET - Abnormal; Notable for the following:    WBC 15.1 (*)    Hemoglobin 11.3 (*)    HCT 34.0 (*)    Neutro Abs 12.9 (*)    All other components within normal limits  URINALYSIS, MICROSCOPIC (REFLEX) - Abnormal; Notable for the following:    Bacteria, UA MANY (*)    Squamous Epithelial / LPF 0-5 (*)    All other components within normal limits  URINE CULTURE  PREGNANCY, URINE  I-STAT CG4 LACTIC ACID, ED    EKG  EKG Interpretation None       Radiology No results  found.  Procedures Procedures (including critical care time)  Medications Ordered in ED Medications  ondansetron (ZOFRAN-ODT) disintegrating tablet 4 mg (4 mg Oral Not Given 06/27/16 1556)  potassium chloride SA (K-DUR,KLOR-CON) CR tablet 60 mEq (not administered)  potassium chloride SA (K-DUR,KLOR-CON) CR tablet 40 mEq (not administered)  ketorolac (TORADOL) 15 MG/ML injection 15 mg (not administered)  sodium chloride (OCEAN) 0.65 % nasal spray 1 spray (not administered)  acetaminophen (TYLENOL) tablet 650 mg (650 mg Oral Given 06/27/16 1431)  ondansetron (ZOFRAN-ODT) disintegrating tablet 4 mg (4 mg Oral Given 06/27/16 1525)  sodium chloride 0.9 % bolus 1,000 mL (0 mLs Intravenous Stopped 06/27/16 1658)  cefTRIAXone (ROCEPHIN) 1 g in dextrose 5 % 50 mL IVPB (0 g Intravenous Stopped 06/27/16 1658)     Initial Impression / Assessment and Plan / ED Course  I have reviewed the triage vital signs and the nursing notes.  Pertinent labs & imaging results that were available during my care of the patient were reviewed by me and considered in my medical decision making (see chart for details).  Clinical Course as of Jun 27 1702  Sat Jun 27, 2016  1703 Pt reassessed. Pt's VSS and WNL except HR is 100 - improved from 120 at arrival. Pt's cap refill < 3 seconds. Pt has been hydrated in the ER and now passed po challenge. We will discharge with antiemetic. Strict ER return precautions have been discussed and pt will return if he is unable to tolerate fluids and symptoms are getting worse.   [AN]    Clinical Course User Index [AN] Derwood KaplanAnkit Hira Trent, MD   Pt comes in with cc of abd pain. She has dysuria, back pain, lower quadrant pain. Pt has fevers, chills. Concerns for pyelonephritis. Sepsis workup started - lactate is < 2.  PO challenge started. K is low - oral K ordered. Pt has no diarrhea.    Final Clinical Impressions(s) / ED Diagnoses   Final diagnoses:  Hypokalemia   Pyelonephritis    New Prescriptions New Prescriptions   No medications on file     Derwood KaplanAnkit Sevan Mcbroom, MD 06/27/16 [phone]

## 2016-06-27 NOTE — ED Triage Notes (Signed)
Patient c/o urinary burning & abd pain since yesterday. She started taking AZO yesterday, but is not getting relief. She was getting her hair done today & states the pain became intense.

## 2016-06-27 NOTE — Discharge Instructions (Signed)
You have kidney infection - treatment will be antibiotics course and good hydration. Please return to the ER if your symptoms worsen; you have increased pain, fevers, chills, inability to keep any medications down, confusion. Otherwise see the outpatient doctor as requested.  Additionally - your potassium was slightly low - and we have given you meds to replete it. Your doctor can recheck in on your next appointment.  See the allergist for the sinusitis.

## 2016-06-30 LAB — URINE CULTURE: Culture: >=100000 — AB

## 2016-10-30 DIAGNOSIS — J3089 Other allergic rhinitis: Secondary | ICD-10-CM | POA: Diagnosis not present

## 2016-10-30 DIAGNOSIS — J4530 Mild persistent asthma, uncomplicated: Secondary | ICD-10-CM | POA: Diagnosis not present

## 2016-11-02 DIAGNOSIS — E559 Vitamin D deficiency, unspecified: Secondary | ICD-10-CM | POA: Diagnosis not present

## 2016-11-26 DIAGNOSIS — J069 Acute upper respiratory infection, unspecified: Secondary | ICD-10-CM | POA: Diagnosis not present

## 2016-11-26 DIAGNOSIS — J45901 Unspecified asthma with (acute) exacerbation: Secondary | ICD-10-CM | POA: Diagnosis not present

## 2016-11-26 DIAGNOSIS — J3089 Other allergic rhinitis: Secondary | ICD-10-CM | POA: Diagnosis not present

## 2016-11-26 DIAGNOSIS — J4530 Mild persistent asthma, uncomplicated: Secondary | ICD-10-CM | POA: Diagnosis not present

## 2016-12-09 DIAGNOSIS — J45901 Unspecified asthma with (acute) exacerbation: Secondary | ICD-10-CM | POA: Diagnosis not present

## 2016-12-09 DIAGNOSIS — J181 Lobar pneumonia, unspecified organism: Secondary | ICD-10-CM | POA: Diagnosis not present

## 2016-12-16 DIAGNOSIS — J181 Lobar pneumonia, unspecified organism: Secondary | ICD-10-CM | POA: Diagnosis not present

## 2016-12-23 DIAGNOSIS — N760 Acute vaginitis: Secondary | ICD-10-CM | POA: Diagnosis not present

## 2017-02-03 DIAGNOSIS — Z0000 Encounter for general adult medical examination without abnormal findings: Secondary | ICD-10-CM | POA: Diagnosis not present

## 2017-02-05 DIAGNOSIS — J3089 Other allergic rhinitis: Secondary | ICD-10-CM | POA: Diagnosis not present

## 2017-03-01 DIAGNOSIS — R1031 Right lower quadrant pain: Secondary | ICD-10-CM | POA: Diagnosis not present

## 2017-04-23 DIAGNOSIS — Z23 Encounter for immunization: Secondary | ICD-10-CM | POA: Diagnosis not present

## 2017-04-28 DIAGNOSIS — J301 Allergic rhinitis due to pollen: Secondary | ICD-10-CM | POA: Diagnosis not present

## 2017-04-28 DIAGNOSIS — J4540 Moderate persistent asthma, uncomplicated: Secondary | ICD-10-CM | POA: Diagnosis not present

## 2017-04-28 DIAGNOSIS — J3089 Other allergic rhinitis: Secondary | ICD-10-CM | POA: Diagnosis not present

## 2017-04-28 DIAGNOSIS — J3081 Allergic rhinitis due to animal (cat) (dog) hair and dander: Secondary | ICD-10-CM | POA: Diagnosis not present

## 2017-05-21 DIAGNOSIS — J3089 Other allergic rhinitis: Secondary | ICD-10-CM | POA: Diagnosis not present

## 2017-05-21 DIAGNOSIS — J029 Acute pharyngitis, unspecified: Secondary | ICD-10-CM | POA: Diagnosis not present

## 2017-05-21 DIAGNOSIS — Z136 Encounter for screening for cardiovascular disorders: Secondary | ICD-10-CM | POA: Diagnosis not present

## 2017-07-02 DIAGNOSIS — J069 Acute upper respiratory infection, unspecified: Secondary | ICD-10-CM | POA: Diagnosis not present

## 2017-07-03 DIAGNOSIS — J069 Acute upper respiratory infection, unspecified: Secondary | ICD-10-CM | POA: Diagnosis not present

## 2017-07-05 DIAGNOSIS — J4530 Mild persistent asthma, uncomplicated: Secondary | ICD-10-CM | POA: Diagnosis not present

## 2017-07-05 DIAGNOSIS — J209 Acute bronchitis, unspecified: Secondary | ICD-10-CM | POA: Diagnosis not present

## 2017-07-09 DIAGNOSIS — J4530 Mild persistent asthma, uncomplicated: Secondary | ICD-10-CM | POA: Diagnosis not present

## 2017-07-09 DIAGNOSIS — J209 Acute bronchitis, unspecified: Secondary | ICD-10-CM | POA: Diagnosis not present

## 2017-07-19 DIAGNOSIS — Z6827 Body mass index (BMI) 27.0-27.9, adult: Secondary | ICD-10-CM | POA: Diagnosis not present

## 2017-07-19 DIAGNOSIS — N870 Mild cervical dysplasia: Secondary | ICD-10-CM | POA: Diagnosis not present

## 2017-07-19 DIAGNOSIS — Z1231 Encounter for screening mammogram for malignant neoplasm of breast: Secondary | ICD-10-CM | POA: Diagnosis not present

## 2017-07-19 DIAGNOSIS — Z1151 Encounter for screening for human papillomavirus (HPV): Secondary | ICD-10-CM | POA: Diagnosis not present

## 2017-07-19 DIAGNOSIS — Z01419 Encounter for gynecological examination (general) (routine) without abnormal findings: Secondary | ICD-10-CM | POA: Diagnosis not present

## 2017-07-30 DIAGNOSIS — J069 Acute upper respiratory infection, unspecified: Secondary | ICD-10-CM | POA: Diagnosis not present

## 2017-09-01 DIAGNOSIS — J3089 Other allergic rhinitis: Secondary | ICD-10-CM | POA: Diagnosis not present

## 2017-09-01 DIAGNOSIS — J4530 Mild persistent asthma, uncomplicated: Secondary | ICD-10-CM | POA: Diagnosis not present

## 2017-12-01 DIAGNOSIS — J4530 Mild persistent asthma, uncomplicated: Secondary | ICD-10-CM | POA: Diagnosis not present

## 2017-12-01 DIAGNOSIS — J3089 Other allergic rhinitis: Secondary | ICD-10-CM | POA: Diagnosis not present

## 2018-01-27 DIAGNOSIS — Z0000 Encounter for general adult medical examination without abnormal findings: Secondary | ICD-10-CM | POA: Diagnosis not present

## 2018-03-09 DIAGNOSIS — J4530 Mild persistent asthma, uncomplicated: Secondary | ICD-10-CM | POA: Diagnosis not present

## 2018-03-09 DIAGNOSIS — J3089 Other allergic rhinitis: Secondary | ICD-10-CM | POA: Diagnosis not present

## 2018-04-18 DIAGNOSIS — Z23 Encounter for immunization: Secondary | ICD-10-CM | POA: Diagnosis not present

## 2018-06-01 DIAGNOSIS — J3081 Allergic rhinitis due to animal (cat) (dog) hair and dander: Secondary | ICD-10-CM | POA: Diagnosis not present

## 2018-06-01 DIAGNOSIS — J3089 Other allergic rhinitis: Secondary | ICD-10-CM | POA: Diagnosis not present

## 2018-06-01 DIAGNOSIS — J301 Allergic rhinitis due to pollen: Secondary | ICD-10-CM | POA: Diagnosis not present

## 2018-06-01 DIAGNOSIS — J4540 Moderate persistent asthma, uncomplicated: Secondary | ICD-10-CM | POA: Diagnosis not present

## 2018-06-29 DIAGNOSIS — J3089 Other allergic rhinitis: Secondary | ICD-10-CM | POA: Diagnosis not present

## 2018-07-12 ENCOUNTER — Other Ambulatory Visit: Payer: Self-pay

## 2018-07-12 ENCOUNTER — Emergency Department (HOSPITAL_BASED_OUTPATIENT_CLINIC_OR_DEPARTMENT_OTHER)
Admission: EM | Admit: 2018-07-12 | Discharge: 2018-07-12 | Disposition: A | Discharge status: Home / Self Care | Payer: BLUE CROSS/BLUE SHIELD | Attending: Emergency Medicine | Admitting: Emergency Medicine

## 2018-07-12 ENCOUNTER — Emergency Department (HOSPITAL_BASED_OUTPATIENT_CLINIC_OR_DEPARTMENT_OTHER): Payer: BLUE CROSS/BLUE SHIELD

## 2018-07-12 ENCOUNTER — Encounter (HOSPITAL_BASED_OUTPATIENT_CLINIC_OR_DEPARTMENT_OTHER): Payer: Self-pay | Admitting: *Deleted

## 2018-07-12 DIAGNOSIS — M5432 Sciatica, left side: Secondary | ICD-10-CM | POA: Diagnosis not present

## 2018-07-12 DIAGNOSIS — Z79899 Other long term (current) drug therapy: Secondary | ICD-10-CM | POA: Diagnosis not present

## 2018-07-12 DIAGNOSIS — M545 Low back pain: Secondary | ICD-10-CM | POA: Diagnosis not present

## 2018-07-12 DIAGNOSIS — J45909 Unspecified asthma, uncomplicated: Secondary | ICD-10-CM | POA: Insufficient documentation

## 2018-07-12 DIAGNOSIS — M5441 Lumbago with sciatica, right side: Secondary | ICD-10-CM | POA: Diagnosis not present

## 2018-07-12 DIAGNOSIS — M5442 Lumbago with sciatica, left side: Secondary | ICD-10-CM | POA: Diagnosis not present

## 2018-07-12 DIAGNOSIS — M5431 Sciatica, right side: Secondary | ICD-10-CM | POA: Insufficient documentation

## 2018-07-12 MED ORDER — KETOROLAC TROMETHAMINE 60 MG/2ML IM SOLN
30.0000 mg | Freq: Once | INTRAMUSCULAR | Status: AC
  Administered 2018-07-12: 30 mg via INTRAMUSCULAR
  Filled 2018-07-12: qty 2

## 2018-07-12 MED ORDER — PREDNISONE 10 MG (21) PO TBPK: ORAL_TABLET | Freq: Every day | ORAL | 0 refills | Status: DC

## 2018-07-12 MED ORDER — ORPHENADRINE CITRATE ER 100 MG PO TB12: 100.0000 mg | ORAL_TABLET | Freq: Two times a day (BID) | ORAL | 0 refills | Status: DC

## 2018-07-12 MED ORDER — MELOXICAM 7.5 MG PO TABS: 7.5000 mg | ORAL_TABLET | Freq: Every day | ORAL | 0 refills | Status: DC

## 2018-07-12 MED ORDER — CYCLOBENZAPRINE HCL 10 MG PO TABS
10.0000 mg | ORAL_TABLET | Freq: Once | ORAL | Status: AC
  Administered 2018-07-12: 10 mg via ORAL
  Filled 2018-07-12: qty 1

## 2018-07-12 MED ORDER — METHYLPREDNISOLONE ACETATE 80 MG/ML IJ SUSP
80.0000 mg | Freq: Once | INTRAMUSCULAR | Status: AC
  Administered 2018-07-12: 80 mg via INTRAMUSCULAR
  Filled 2018-07-12: qty 1
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
## 2018-07-12 NOTE — Discharge Instructions (Addendum)
Warm compresses to low back for 20 minutes at a time. Take prednisone as prescribed and complete the full course. Take Norflex and meloxicam as needed as prescribed.  Do not drive or operate machinery while taking Norflex. You can also apply topical lidocaine patches to your back for pain relief. Follow-up with your primary care provider, referral given if needed.  Return to ER for new or worsening symptoms.

## 2018-07-12 NOTE — ED Triage Notes (Signed)
Back pain with radiation down her right leg. Pain increases when she tries to stand.

## 2018-07-12 NOTE — ED Notes (Signed)
Patient transported to X-ray 

## 2018-09-14 DIAGNOSIS — Z113 Encounter for screening for infections with a predominantly sexual mode of transmission: Secondary | ICD-10-CM | POA: Diagnosis not present

## 2018-09-14 DIAGNOSIS — Z01419 Encounter for gynecological examination (general) (routine) without abnormal findings: Secondary | ICD-10-CM | POA: Diagnosis not present

## 2018-09-14 DIAGNOSIS — Z6827 Body mass index (BMI) 27.0-27.9, adult: Secondary | ICD-10-CM | POA: Diagnosis not present

## 2018-09-14 DIAGNOSIS — Z1151 Encounter for screening for human papillomavirus (HPV): Secondary | ICD-10-CM | POA: Diagnosis not present

## 2018-09-14 DIAGNOSIS — Z1231 Encounter for screening mammogram for malignant neoplasm of breast: Secondary | ICD-10-CM | POA: Diagnosis not present

## 2018-09-14 DIAGNOSIS — N870 Mild cervical dysplasia: Secondary | ICD-10-CM | POA: Diagnosis not present

## 2018-09-29 DIAGNOSIS — Z760 Encounter for issue of repeat prescription: Secondary | ICD-10-CM | POA: Diagnosis not present

## 2018-09-29 DIAGNOSIS — J3089 Other allergic rhinitis: Secondary | ICD-10-CM | POA: Diagnosis not present

## 2018-09-29 DIAGNOSIS — J4530 Mild persistent asthma, uncomplicated: Secondary | ICD-10-CM | POA: Diagnosis not present

## 2018-11-08 DIAGNOSIS — M25532 Pain in left wrist: Secondary | ICD-10-CM | POA: Diagnosis not present

## 2018-11-22 DIAGNOSIS — M25532 Pain in left wrist: Secondary | ICD-10-CM | POA: Diagnosis not present

## 2018-12-20 DIAGNOSIS — M25532 Pain in left wrist: Secondary | ICD-10-CM | POA: Diagnosis not present

## 2018-12-23 DIAGNOSIS — J4540 Moderate persistent asthma, uncomplicated: Secondary | ICD-10-CM | POA: Diagnosis not present

## 2020-06-29 IMAGING — CR DG LUMBAR SPINE COMPLETE 4+V
5 series · 5 of 5 positions shown · non-contrast
Comparison: None.

CLINICAL DATA: Low back pain.

EXAM:
LUMBAR SPINE - COMPLETE 4+ VIEW

[t l-spine a.p.]
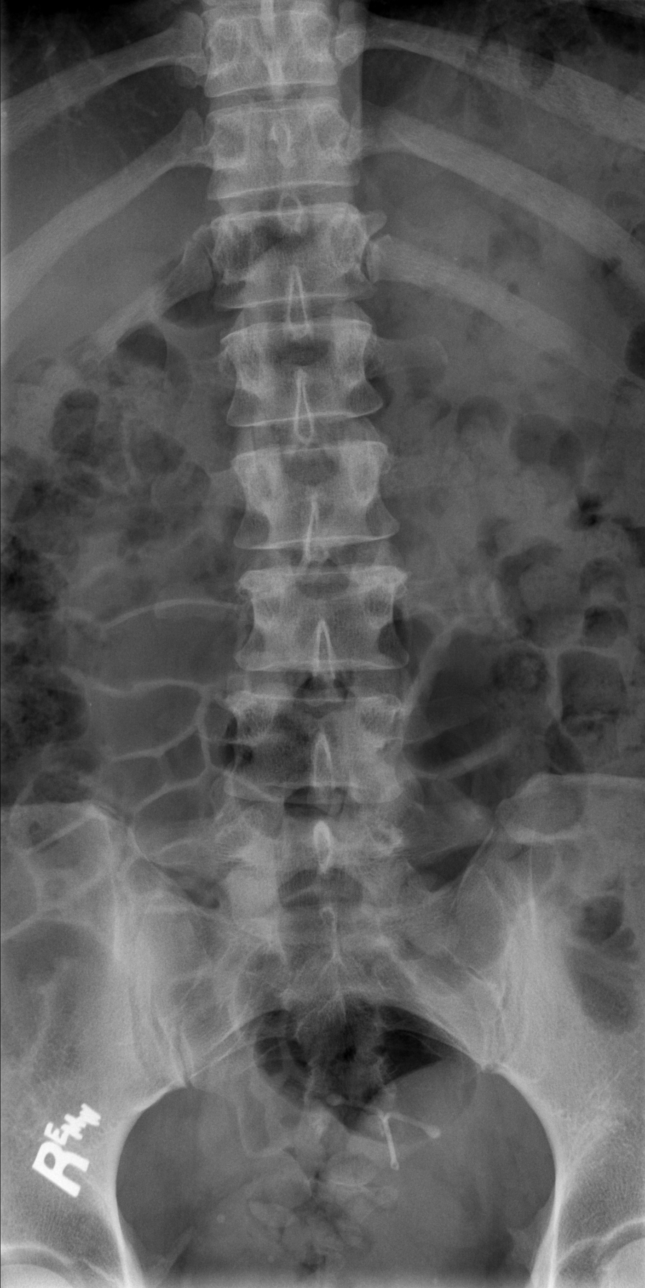

[t l-spine oblique exposure (1 of 2)]
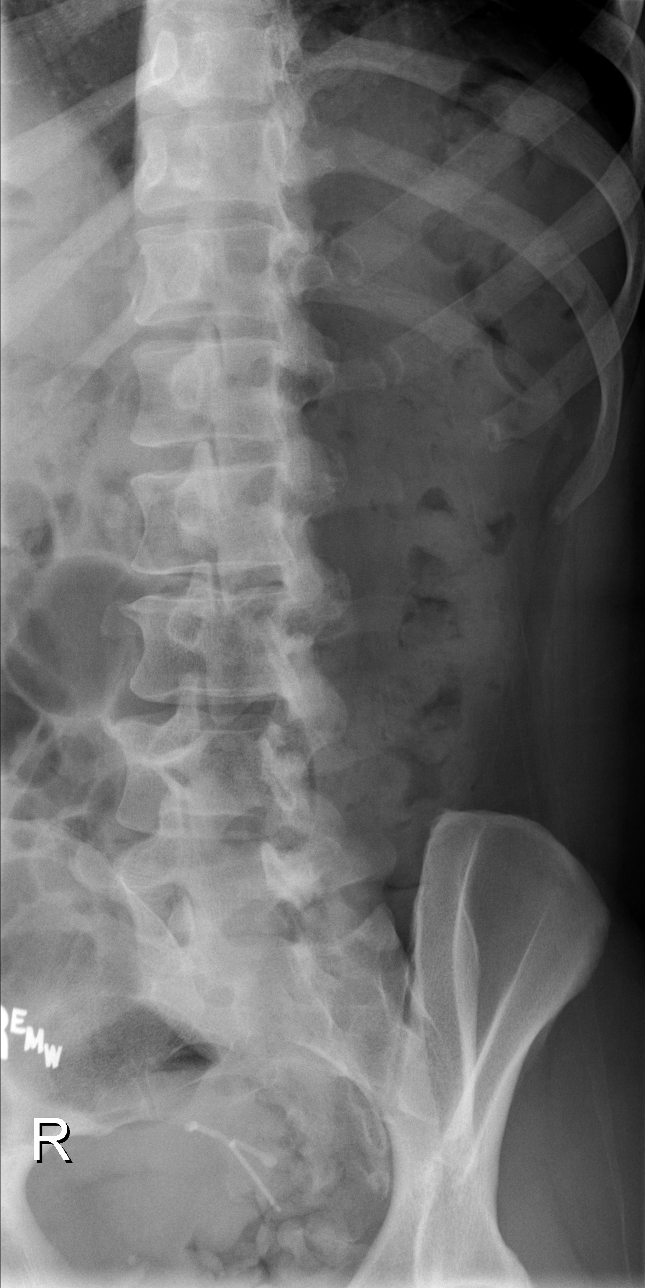

[t l-spine oblique exposure (2 of 2)]
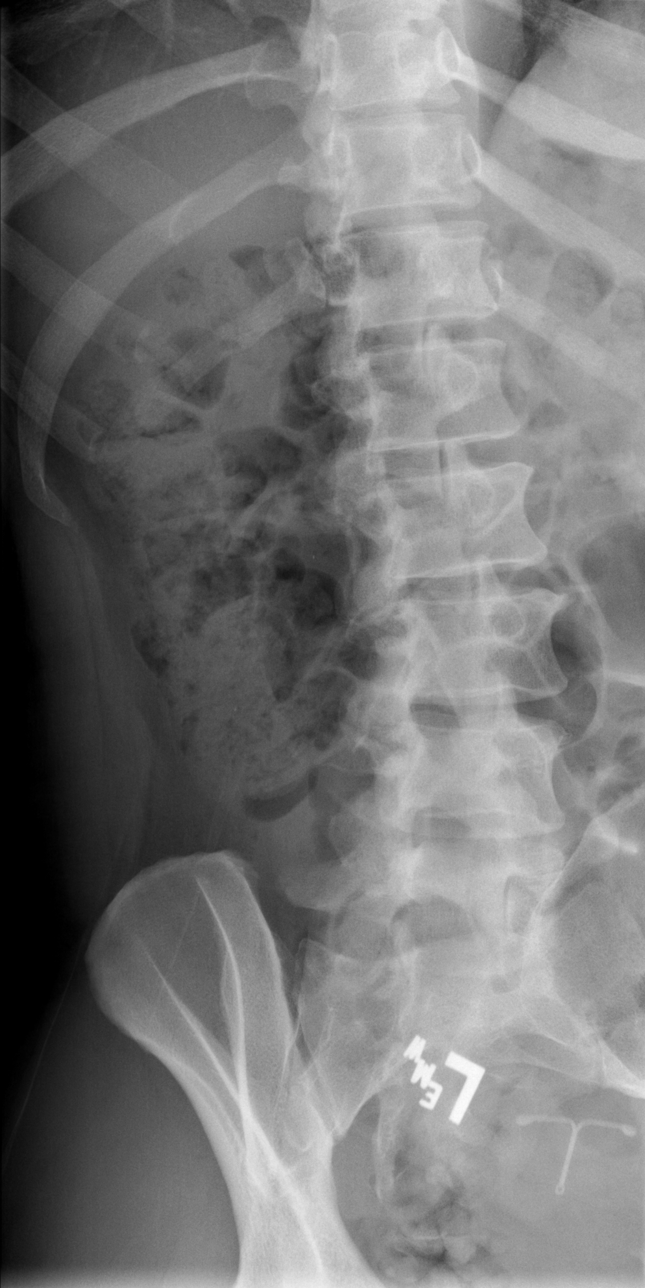

[t l-spine lat]
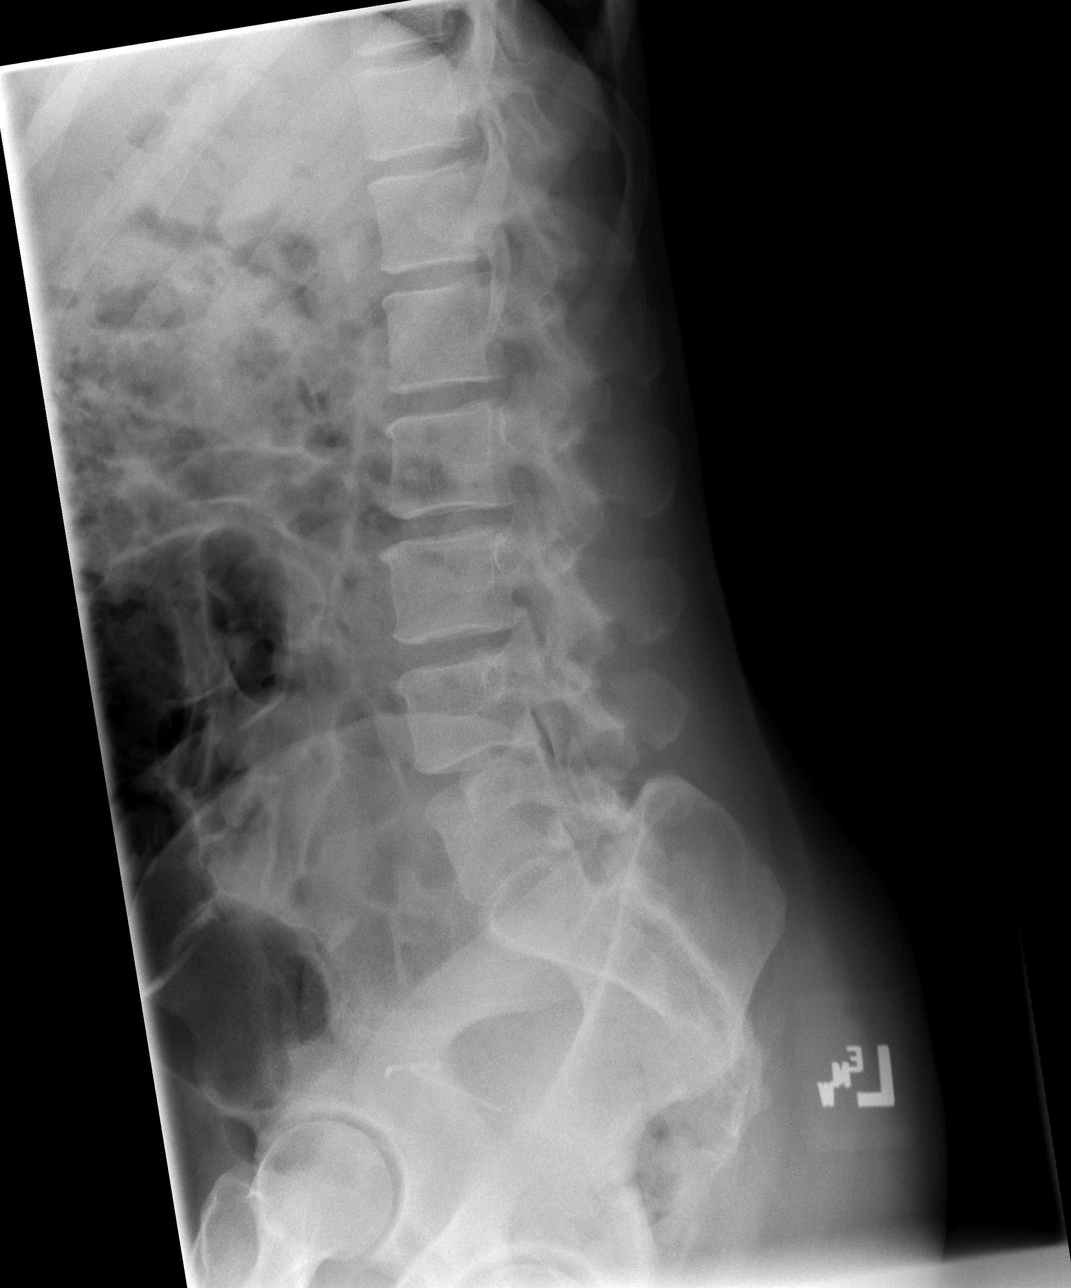

[t l-spine l5-s1 spot]
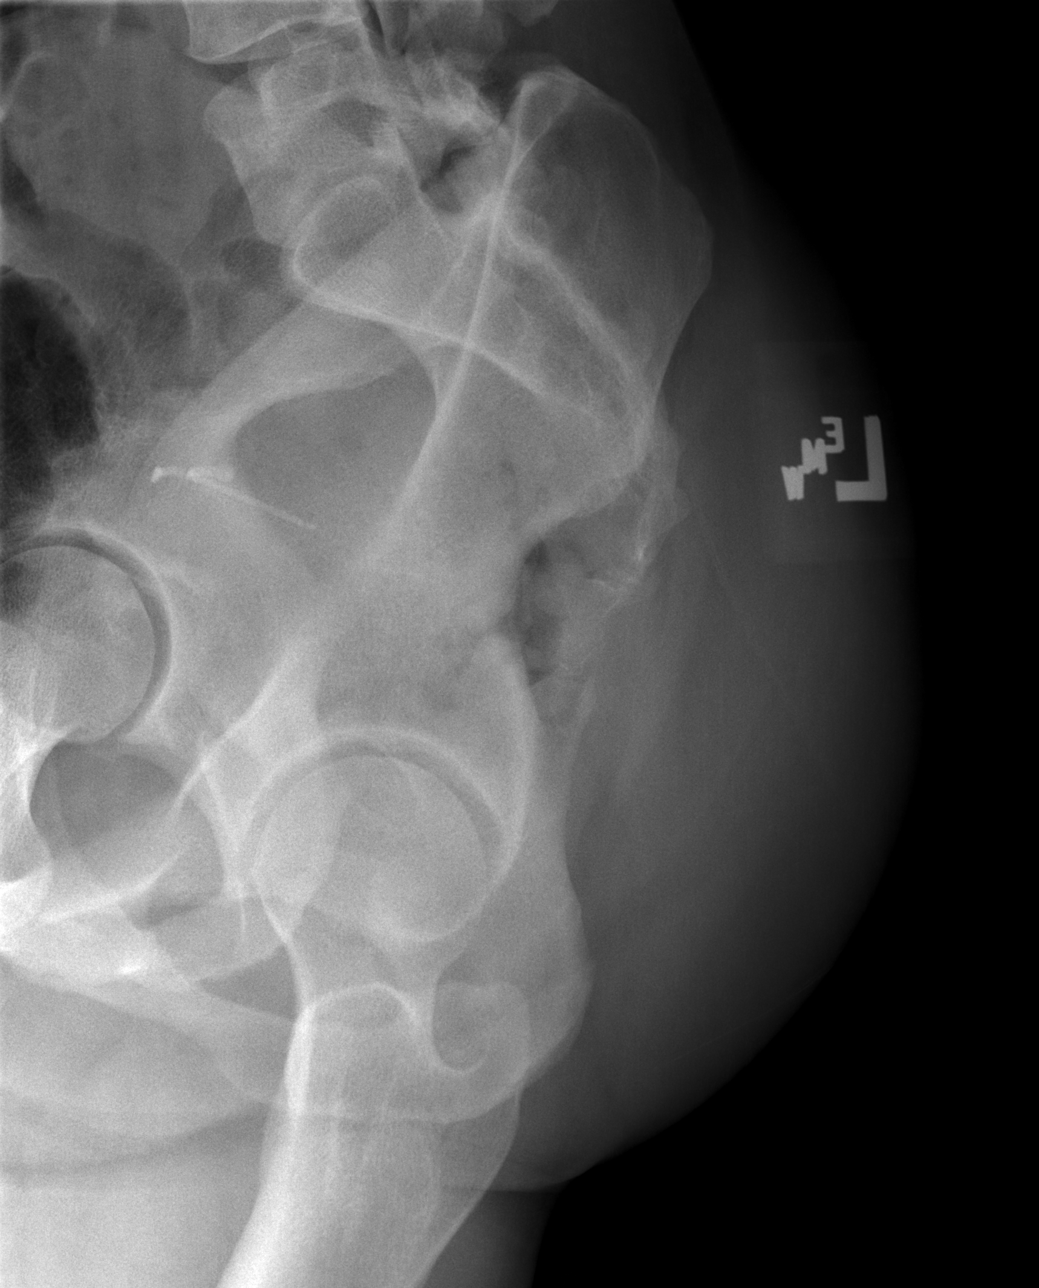

[5 of 5 positions shown; findings below may reference images not displayed]

FINDINGS: There is no evidence of lumbar spine fracture. Alignment is normal.
Intervertebral disc spaces are maintained.
IMPRESSION: Negative.

## 2022-01-26 ENCOUNTER — Other Ambulatory Visit: Payer: Self-pay

## 2022-01-26 ENCOUNTER — Encounter (HOSPITAL_BASED_OUTPATIENT_CLINIC_OR_DEPARTMENT_OTHER): Payer: Self-pay

## 2022-01-26 ENCOUNTER — Emergency Department (HOSPITAL_BASED_OUTPATIENT_CLINIC_OR_DEPARTMENT_OTHER)
Admission: EM | Admit: 2022-01-26 | Discharge: 2022-01-26 | Disposition: A | Discharge status: Home / Self Care | Payer: BC Managed Care – PPO | Attending: Emergency Medicine | Admitting: Emergency Medicine

## 2022-01-26 DIAGNOSIS — R519 Headache, unspecified: Secondary | ICD-10-CM | POA: Insufficient documentation

## 2022-01-26 DIAGNOSIS — R42 Dizziness and giddiness: Secondary | ICD-10-CM | POA: Insufficient documentation

## 2022-01-26 DIAGNOSIS — R55 Syncope and collapse: Secondary | ICD-10-CM | POA: Insufficient documentation

## 2022-01-26 DIAGNOSIS — J45909 Unspecified asthma, uncomplicated: Secondary | ICD-10-CM | POA: Diagnosis not present

## 2022-01-26 DIAGNOSIS — Z7952 Long term (current) use of systemic steroids: Secondary | ICD-10-CM | POA: Diagnosis not present

## 2022-01-26 LAB — URINALYSIS, ROUTINE W REFLEX MICROSCOPIC
Bilirubin Urine: NEGATIVE
Glucose, UA: NEGATIVE mg/dL
Hgb urine dipstick: NEGATIVE
Ketones, ur: NEGATIVE mg/dL
Leukocytes,Ua: NEGATIVE
Nitrite: NEGATIVE
Protein, ur: NEGATIVE mg/dL
Specific Gravity, Urine: 1.02 (ref 1.005–1.030)
pH: 6.5 (ref 5.0–8.0)

## 2022-01-26 LAB — PREGNANCY, URINE: Preg Test, Ur: NEGATIVE

## 2022-01-26 LAB — CBC
HCT: 37.7 % (ref 36.0–46.0)
Hemoglobin: 12.2 g/dL (ref 12.0–15.0)
MCH: 26.6 pg (ref 26.0–34.0)
MCHC: 32.4 g/dL (ref 30.0–36.0)
MCV: 82.3 fL (ref 80.0–100.0)
Platelets: 322 10*3/uL (ref 150–400)
RBC: 4.58 MIL/uL (ref 3.87–5.11)
RDW: 14.5 % (ref 11.5–15.5)
WBC: 4.9 10*3/uL (ref 4.0–10.5)
nRBC: 0 % (ref 0.0–0.2)

## 2022-01-26 LAB — BASIC METABOLIC PANEL
Anion gap: 5 (ref 5–15)
BUN: 12 mg/dL (ref 6–20)
CO2: 25 mmol/L (ref 22–32)
Calcium: 9 mg/dL (ref 8.9–10.3)
Chloride: 107 mmol/L (ref 98–111)
Creatinine, Ser: 0.88 mg/dL (ref 0.44–1.00)
GFR, Estimated: >60 mL/min (ref >60)
Glucose, Bld: 81 mg/dL (ref 70–99)
Potassium: 3.8 mmol/L (ref 3.5–5.1)
Sodium: 137 mmol/L (ref 135–145)

## 2022-01-26 LAB — CBG MONITORING, ED: Glucose-Capillary: 79 mg/dL (ref 70–99)

## 2022-01-26 MED ORDER — METOCLOPRAMIDE HCL 5 MG/ML IJ SOLN
10.0000 mg | Freq: Once | INTRAMUSCULAR | Status: AC
  Administered 2022-01-26: 10 mg via INTRAVENOUS
  Filled 2022-01-26: qty 2

## 2022-01-26 MED ORDER — SODIUM CHLORIDE 0.9 % IV BOLUS
1000.0000 mL | Freq: Once | INTRAVENOUS | Status: AC
  Administered 2022-01-26: 1000 mL via INTRAVENOUS

## 2022-01-26 MED ORDER — DIPHENHYDRAMINE HCL 50 MG/ML IJ SOLN
50.0000 mg | Freq: Once | INTRAMUSCULAR | Status: AC
  Administered 2022-01-26: 50 mg via INTRAVENOUS
  Filled 2022-01-26: qty 1

## 2022-01-26 NOTE — Discharge Instructions (Addendum)
You came to the emergency department today to be evaluated for your headache and syncopal episode.  Your clinical exam and lab results were reassuring.  Please follow-up with your neurologist for further management of your migraines.  Get help right away if: Your headache: Becomes severe quickly. Gets worse after moderate to intense physical activity. You have any of these symptoms: Repeated vomiting. Pain or stiffness in your neck. Changes to your vision. Pain in an eye or ear. Problems with speech. Muscular weakness or loss of muscle control. Loss of balance or coordination. You feel faint or pass out. You have confusion. You have a seizure.

## 2022-01-26 NOTE — ED Triage Notes (Signed)
C/o head pressure since yesterday, feels lightheaded, dizziness, headache today. Manager thinks she had a syncopal episode today.

## 2022-01-26 NOTE — ED Notes (Signed)
Dc instructions reviewed with pt no questions or concerns at this time. Will follow up with neurology. Pt wheeled out of ED and transported home by friend
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
## 2022-02-15 ENCOUNTER — Other Ambulatory Visit: Payer: Self-pay

## 2022-02-15 ENCOUNTER — Encounter (HOSPITAL_BASED_OUTPATIENT_CLINIC_OR_DEPARTMENT_OTHER): Payer: Self-pay | Admitting: Emergency Medicine

## 2022-02-15 ENCOUNTER — Emergency Department (HOSPITAL_BASED_OUTPATIENT_CLINIC_OR_DEPARTMENT_OTHER): Payer: BC Managed Care – PPO

## 2022-02-15 ENCOUNTER — Emergency Department (HOSPITAL_BASED_OUTPATIENT_CLINIC_OR_DEPARTMENT_OTHER)
Admission: EM | Admit: 2022-02-15 | Discharge: 2022-02-15 | Disposition: A | Discharge status: Home / Self Care | Payer: BC Managed Care – PPO | Attending: Emergency Medicine | Admitting: Emergency Medicine

## 2022-02-15 DIAGNOSIS — R519 Headache, unspecified: Secondary | ICD-10-CM

## 2022-02-15 DIAGNOSIS — R109 Unspecified abdominal pain: Secondary | ICD-10-CM | POA: Insufficient documentation

## 2022-02-15 DIAGNOSIS — J45909 Unspecified asthma, uncomplicated: Secondary | ICD-10-CM | POA: Diagnosis not present

## 2022-02-15 DIAGNOSIS — R350 Frequency of micturition: Secondary | ICD-10-CM | POA: Diagnosis not present

## 2022-02-15 DIAGNOSIS — R55 Syncope and collapse: Secondary | ICD-10-CM | POA: Insufficient documentation

## 2022-02-15 DIAGNOSIS — K7689 Other specified diseases of liver: Secondary | ICD-10-CM | POA: Diagnosis not present

## 2022-02-15 DIAGNOSIS — M545 Low back pain, unspecified: Secondary | ICD-10-CM | POA: Insufficient documentation

## 2022-02-15 LAB — URINALYSIS, ROUTINE W REFLEX MICROSCOPIC
Bilirubin Urine: NEGATIVE
Glucose, UA: NEGATIVE mg/dL
Hgb urine dipstick: NEGATIVE
Ketones, ur: NEGATIVE mg/dL
Leukocytes,Ua: NEGATIVE
Nitrite: NEGATIVE
Protein, ur: NEGATIVE mg/dL
Specific Gravity, Urine: 1.005 (ref 1.005–1.030)
pH: 5.5 (ref 5.0–8.0)

## 2022-02-15 LAB — COMPREHENSIVE METABOLIC PANEL
ALT: 13 U/L (ref 0–44)
AST: 14 U/L — ABNORMAL LOW (ref 15–41)
Albumin: 4.4 g/dL (ref 3.5–5.0)
Alkaline Phosphatase: 47 U/L (ref 38–126)
Anion gap: 8 (ref 5–15)
BUN: 10 mg/dL (ref 6–20)
CO2: 28 mmol/L (ref 22–32)
Calcium: 9.8 mg/dL (ref 8.9–10.3)
Chloride: 104 mmol/L (ref 98–111)
Creatinine, Ser: 0.94 mg/dL (ref 0.44–1.00)
GFR, Estimated: >60 mL/min (ref >60)
Glucose, Bld: 79 mg/dL (ref 70–99)
Potassium: 3.7 mmol/L (ref 3.5–5.1)
Sodium: 140 mmol/L (ref 135–145)
Total Bilirubin: 0.4 mg/dL (ref 0.3–1.2)
Total Protein: 7.3 g/dL (ref 6.5–8.1)

## 2022-02-15 LAB — CBC WITH DIFFERENTIAL/PLATELET
Abs Immature Granulocytes: 0.02 10*3/uL (ref 0.00–0.07)
Basophils Absolute: 0 10*3/uL (ref 0.0–0.1)
Basophils Relative: 1 %
Eosinophils Absolute: 0.1 10*3/uL (ref 0.0–0.5)
Eosinophils Relative: 2 %
HCT: 39.1 % (ref 36.0–46.0)
Hemoglobin: 12.7 g/dL (ref 12.0–15.0)
Immature Granulocytes: 0 %
Lymphocytes Relative: 47 %
Lymphs Abs: 2.4 10*3/uL (ref 0.7–4.0)
MCH: 26.6 pg (ref 26.0–34.0)
MCHC: 32.5 g/dL (ref 30.0–36.0)
MCV: 81.8 fL (ref 80.0–100.0)
Monocytes Absolute: 0.4 10*3/uL (ref 0.1–1.0)
Monocytes Relative: 9 %
Neutro Abs: 2.1 10*3/uL (ref 1.7–7.7)
Neutrophils Relative %: 41 %
Platelets: 321 10*3/uL (ref 150–400)
RBC: 4.78 MIL/uL (ref 3.87–5.11)
RDW: 13.5 % (ref 11.5–15.5)
WBC: 5 10*3/uL (ref 4.0–10.5)
nRBC: 0 % (ref 0.0–0.2)

## 2022-02-15 LAB — PREGNANCY, URINE: Preg Test, Ur: NEGATIVE

## 2022-02-15 MED ORDER — PROCHLORPERAZINE MALEATE 10 MG PO TABS: 10.0000 mg | ORAL_TABLET | Freq: Two times a day (BID) | ORAL | 0 refills | Status: DC | PRN

## 2022-02-15 MED ORDER — KETOROLAC TROMETHAMINE 60 MG/2ML IM SOLN
60.0000 mg | Freq: Once | INTRAMUSCULAR | Status: AC
Start: 2022-02-15 — End: 2022-02-15
  Administered 2022-02-15: 60 mg via INTRAMUSCULAR
  Filled 2022-02-15: qty 2

## 2022-02-15 MED ORDER — ACETAMINOPHEN 500 MG PO TABS
1000.0000 mg | ORAL_TABLET | Freq: Once | ORAL | Status: AC
  Administered 2022-02-15: 1000 mg via ORAL
  Filled 2022-02-15: qty 2

## 2022-02-15 MED ORDER — PROCHLORPERAZINE EDISYLATE 10 MG/2ML IJ SOLN: 10.0000 mg | Freq: Once | INTRAMUSCULAR | Status: DC

## 2022-02-15 MED ORDER — DIPHENHYDRAMINE HCL 50 MG/ML IJ SOLN: 25.0000 mg | Freq: Once | INTRAMUSCULAR | Status: DC

## 2022-02-15 MED ORDER — PROCHLORPERAZINE MALEATE 10 MG PO TABS
10.0000 mg | ORAL_TABLET | Freq: Once | ORAL | Status: AC
  Administered 2022-02-15: 10 mg via ORAL
  Filled 2022-02-15: qty 1

## 2022-02-15 NOTE — ED Notes (Signed)
Patient transported to CT 

## 2022-02-15 NOTE — ED Triage Notes (Signed)
Back pain ,kinda feels like her last UTI,but no painful urination. Does have pressure in pelvis. And migraine has returned.

## 2022-02-15 NOTE — ED Provider Notes (Signed)
[facility] EMERGENCY DEPT Provider Note   CSN: [id number] Arrival date & time: 02/15/22  0846     History  Chief Complaint  Patient presents with   Back Pain    Tamara Bowers is a 51 y.o. female.  HPI     Discomfort and pressure in groin on inside of legs and pressure in back 7/17 had migraine, dehydration, exhaustion went to ED This Friday and Saturday discomfort and headaches came back Feels sensation like needs to go to the bathroom, feels it in bilateral flank, feels like kidneys tightening up then urinates and feels better Hydrating, feels like urinating a lot Urinary frequency, no dysuria, urgency  No abdominal pain No vaginal bleeding or discharge IUD and not getting menses No fever No numbness/weakness No injuries or falls Hx of migraines, this headache feels similar, started Friday, initially a dull headache waxing and waning, didn't take anything, yesterday was more severe, in morning feels fatigue Nausea yesterday. Lightheadedness last night. No vomiting No diarrhea, constipation, black or bloody stools  Past Medical History:  Diagnosis Date   Asthma    History reviewed. No pertinent surgical history.  Home Medications Prior to Admission medications   Medication Sig Start Date End Date Taking? Authorizing Provider  prochlorperazine (COMPAZINE) 10 MG tablet Take 1 tablet (10 mg total) by mouth 2 (two) times daily as needed for nausea or vomiting (or headache, may take with benadryl 25mg ). 02/15/22  Yes 04/17/22, MD  Loratadine (CLARITIN PO) Take by mouth.    [provider]  meloxicam (MOBIC) 7.5 MG tablet Take 1 tablet (7.5 mg total) by mouth daily. 07/12/18   07/14/18, PA-C  mometasone (NASONEX) 50 MCG/ACT nasal spray Place 2 sprays into the nose daily.    [provider]  Montelukast Sodium (SINGULAIR PO) Take by mouth.    [provider]  orphenadrine (NORFLEX) 100 MG tablet Take 1 tablet (100 mg  total) by mouth 2 (two) times daily. 07/12/18   07/14/18, PA-C  predniSONE (STERAPRED UNI-PAK 21 TAB) 10 MG (21) TBPK tablet Take by mouth daily. Take 6 tabs by mouth daily  for 2 days, then 5 tabs for 2 days, then 4 tabs for 2 days, then 3 tabs for 2 days, 2 tabs for 2 days, then 1 tab by mouth daily for 2 days 07/12/18   07/14/18, PA-C      Allergies    Other    Review of Systems   Review of Systems  Physical Exam Updated Vital Signs BP 139/77 (BP Location: Left Arm)   Pulse 72   Temp 98.2 F (36.8 C) (Oral)   Resp 16   SpO2 100%  Physical Exam Vitals and nursing note reviewed.  Constitutional:      General: She is not in acute distress.    Appearance: Normal appearance. She is well-developed. She is not ill-appearing or diaphoretic.  HENT:     Head: Normocephalic and atraumatic.  Eyes:     General: No visual field deficit.    Extraocular Movements: Extraocular movements intact.     Conjunctiva/sclera: Conjunctivae normal.     Pupils: Pupils are equal, round, and reactive to light.  Cardiovascular:     Rate and Rhythm: Normal rate and regular rhythm.     Pulses: Normal pulses.     Heart sounds: Normal heart sounds. No murmur heard.    No friction rub. No gallop.  Pulmonary:     Effort: Pulmonary effort is  normal. No respiratory distress.     Breath sounds: Normal breath sounds. No wheezing or rales.  Abdominal:     General: There is no distension.     Palpations: Abdomen is soft.     Tenderness: There is no abdominal tenderness. There is right CVA tenderness and left CVA tenderness. There is no guarding.  Musculoskeletal:        General: No swelling or tenderness.     Cervical back: Normal range of motion.  Skin:    General: Skin is warm and dry.     Findings: No erythema or rash.  Neurological:     General: No focal deficit present.     Mental Status: She is alert and oriented to person, place, and time.     GCS: GCS eye subscore is 4. GCS verbal  subscore is 5. GCS motor subscore is 6.     Cranial Nerves: No cranial nerve deficit, dysarthria or facial asymmetry.     Sensory: No sensory deficit.     Motor: No weakness or tremor.     Coordination: Coordination normal. Finger-Nose-Finger Test normal.     Gait: Gait normal.     ED Results / Procedures / Treatments   Labs (all labs ordered are listed, but only abnormal results are displayed) Labs Reviewed  URINALYSIS, ROUTINE W REFLEX MICROSCOPIC - Abnormal; Notable for the following components:      Result Value   Color, Urine COLORLESS (*)    All other components within normal limits  COMPREHENSIVE METABOLIC PANEL - Abnormal; Notable for the following components:   AST 14 (*)    All other components within normal limits  PREGNANCY, URINE  CBC WITH DIFFERENTIAL/PLATELET    EKG None  Radiology CT Head Wo Contrast  Result Date: 02/15/2022 CLINICAL DATA:  Headache.  History of migraines. EXAM: CT HEAD WITHOUT CONTRAST TECHNIQUE: Contiguous axial images were obtained from the base of the skull through the vertex without intravenous contrast. RADIATION DOSE REDUCTION: This exam was performed according to the departmental dose-optimization program which includes automated exposure control, adjustment of the mA and/or kV according to patient size and/or use of iterative reconstruction technique. COMPARISON:  CT head 10/19/2003. FINDINGS: Brain: There is no evidence of acute intracranial hemorrhage, mass lesion, brain edema or extra-axial fluid collection. The ventricles and subarachnoid spaces are appropriately sized for age. There is no CT evidence of acute cortical infarction. Vascular:  No hyperdense vessel identified. Skull: Negative for fracture or focal lesion. Sinuses/Orbits: The visualized paranasal sinuses and mastoid air cells are clear. No orbital abnormalities are seen. Other: None. IMPRESSION: Stable noncontrast head CT.  No acute intracranial findings. Electronically Signed    By: Richardean Sale M.D.   On: 02/15/2022 15:09   CT Renal Stone Study  Result Date: 02/15/2022 CLINICAL DATA:  Flank pain. Evaluate for kidney stone. EXAM: CT ABDOMEN AND PELVIS WITHOUT CONTRAST TECHNIQUE: Multidetector CT imaging of the abdomen and pelvis was performed following the standard protocol without IV contrast. RADIATION DOSE REDUCTION: This exam was performed according to the departmental dose-optimization program which includes automated exposure control, adjustment of the mA and/or kV according to patient size and/or use of iterative reconstruction technique. COMPARISON:  None Available. FINDINGS: Lower chest: No acute abnormality. Hepatobiliary: Low-attenuation structure within segment 3 measures 5 mm and is technically too small to characterize. No suspicious liver lesions identified. Gallbladder appears normal. No bile duct dilatation. No gallstones, gallbladder wall thickening, or biliary dilatation. Pancreas: Unremarkable. No pancreatic ductal dilatation  or surrounding inflammatory changes. Spleen: Normal in size without focal abnormality. Adrenals/Urinary Tract: Normal adrenal glands. No signs nephrolithiasis, hydronephrosis or kidney mass. Urinary bladder is unremarkable. Stomach/Bowel: Stomach appears within normal limits. The appendix is visualized and appears normal. No bowel wall thickening, inflammation, or distension. Vascular/Lymphatic: No significant vascular findings are present. No enlarged abdominal or pelvic lymph nodes. Reproductive: Uterus and bilateral adnexa are unremarkable. IUD identified within the uterus. Other: There is no free fluid or fluid collections Musculoskeletal: No acute or significant osseous findings. IMPRESSION: No acute findings within the abdomen or pelvis. Electronically Signed   By: Signa Kell M.D.   On: 02/15/2022 13:24    Procedures Procedures    Medications Ordered in ED Medications  ketorolac (TORADOL) injection 60 mg (60 mg Intramuscular  Given 02/15/22 1211)  prochlorperazine (COMPAZINE) tablet 10 mg (10 mg Oral Given 02/15/22 1537)  acetaminophen (TYLENOL) tablet 1,000 mg (1,000 mg Oral Given 02/15/22 1537)    ED Course/ Medical Decision Making/ A&P                           Medical Decision Making Amount and/or Complexity of Data Reviewed Labs: ordered. Radiology: ordered.  Risk OTC drugs. Prescription drug management.   51yo female with history of asthma presents with concern for urinary frequency, back pain, pressure, headache.  DDx includes appendicitis, pancreatitis, cholecystitis, pyelonephritis, nephrolithiasis, diverticulitis, SBO, dissection.   DDx for headache includes meningitis, ICH, mass, IIH.   Labs completed and personally evalutaed by me without leukocytosis, anemia, hepatitis.  UA without signs of infection.  CT renal study personally evaluated and interpreted by me shows no sign of nephrolithiasis or other acute abnormalities.Normal pulses, do not feel hx/exam consistent with dissection. No sign of cauda equina/epidural abscess. Do not suspect pelvic etiology.   Consider msk etiology of symptoms, interstitial cystitis.  CT head completed and without signs of acute abnormalities. No signs of meningitis, doubt occult SAH.   GIven rx for compazine. Recommend PCP follow up and return precautions.  Patient discharged in stable condition with understanding of reasons to return.             Final Clinical Impression(s) / ED Diagnoses Final diagnoses:  Acute nonintractable headache, unspecified headache type  Low back pain without sciatica, unspecified back pain laterality, unspecified chronicity  Urinary frequency    Rx / DC Orders ED Discharge Orders          Ordered    prochlorperazine (COMPAZINE) 10 MG tablet  2 times daily PRN        02/15/22 1531              Alvira Monday, MD 02/15/22 2303

## 2022-02-15 NOTE — ED Notes (Signed)
Pt presents with suprapubic pain radiating into upper thighs, bilateral back pain, frequency and urgency for a while. Seen at [facility] ED for migraine, received "migraine cocktail" ands fluids. Pt reports her urinary symptoms started after trying to rehydrate from the ED visit.

## 2022-03-05 DIAGNOSIS — G43719 Chronic migraine without aura, intractable, without status migrainosus: Secondary | ICD-10-CM | POA: Diagnosis not present

## 2022-03-05 DIAGNOSIS — Z049 Encounter for examination and observation for unspecified reason: Secondary | ICD-10-CM | POA: Diagnosis not present

## 2022-03-05 DIAGNOSIS — Z79899 Other long term (current) drug therapy: Secondary | ICD-10-CM | POA: Diagnosis not present

## 2022-03-09 DIAGNOSIS — M791 Myalgia, unspecified site: Secondary | ICD-10-CM | POA: Diagnosis not present

## 2022-03-09 DIAGNOSIS — G43719 Chronic migraine without aura, intractable, without status migrainosus: Secondary | ICD-10-CM | POA: Diagnosis not present

## 2022-03-09 DIAGNOSIS — M542 Cervicalgia: Secondary | ICD-10-CM | POA: Diagnosis not present

## 2022-03-09 DIAGNOSIS — G518 Other disorders of facial nerve: Secondary | ICD-10-CM | POA: Diagnosis not present

## 2022-03-26 ENCOUNTER — Emergency Department (HOSPITAL_BASED_OUTPATIENT_CLINIC_OR_DEPARTMENT_OTHER)
Admission: EM | Admit: 2022-03-26 | Discharge: 2022-03-26 | Disposition: A | Discharge status: Home / Self Care | Payer: BC Managed Care – PPO | Attending: Emergency Medicine | Admitting: Emergency Medicine

## 2022-03-26 ENCOUNTER — Other Ambulatory Visit (HOSPITAL_BASED_OUTPATIENT_CLINIC_OR_DEPARTMENT_OTHER): Payer: Self-pay

## 2022-03-26 ENCOUNTER — Emergency Department (HOSPITAL_BASED_OUTPATIENT_CLINIC_OR_DEPARTMENT_OTHER): Payer: BC Managed Care – PPO | Admitting: Radiology

## 2022-03-26 ENCOUNTER — Other Ambulatory Visit: Payer: Self-pay

## 2022-03-26 ENCOUNTER — Encounter (HOSPITAL_BASED_OUTPATIENT_CLINIC_OR_DEPARTMENT_OTHER): Payer: Self-pay | Admitting: Emergency Medicine

## 2022-03-26 DIAGNOSIS — U071 COVID-19: Secondary | ICD-10-CM | POA: Diagnosis not present

## 2022-03-26 DIAGNOSIS — J4541 Moderate persistent asthma with (acute) exacerbation: Secondary | ICD-10-CM | POA: Diagnosis not present

## 2022-03-26 DIAGNOSIS — R0602 Shortness of breath: Secondary | ICD-10-CM | POA: Diagnosis not present

## 2022-03-26 DIAGNOSIS — Z79899 Other long term (current) drug therapy: Secondary | ICD-10-CM | POA: Diagnosis not present

## 2022-03-26 DIAGNOSIS — Z20822 Contact with and (suspected) exposure to covid-19: Secondary | ICD-10-CM | POA: Insufficient documentation

## 2022-03-26 LAB — BASIC METABOLIC PANEL
Anion gap: 9 (ref 5–15)
BUN: 8 mg/dL (ref 6–20)
CO2: 27 mmol/L (ref 22–32)
Calcium: 9.3 mg/dL (ref 8.9–10.3)
Chloride: 99 mmol/L (ref 98–111)
Creatinine, Ser: 1.09 mg/dL — ABNORMAL HIGH (ref 0.44–1.00)
GFR, Estimated: >60 mL/min (ref >60)
Glucose, Bld: 92 mg/dL (ref 70–99)
Potassium: 3.5 mmol/L (ref 3.5–5.1)
Sodium: 135 mmol/L (ref 135–145)

## 2022-03-26 LAB — CBC WITH DIFFERENTIAL/PLATELET
Abs Immature Granulocytes: 0.01 10*3/uL (ref 0.00–0.07)
Basophils Absolute: 0 10*3/uL (ref 0.0–0.1)
Basophils Relative: 1 %
Eosinophils Absolute: 0 10*3/uL (ref 0.0–0.5)
Eosinophils Relative: 0 %
HCT: 40.5 % (ref 36.0–46.0)
Hemoglobin: 13 g/dL (ref 12.0–15.0)
Immature Granulocytes: 0 %
Lymphocytes Relative: 49 %
Lymphs Abs: 1.5 10*3/uL (ref 0.7–4.0)
MCH: 26 pg (ref 26.0–34.0)
MCHC: 32.1 g/dL (ref 30.0–36.0)
MCV: 81 fL (ref 80.0–100.0)
Monocytes Absolute: 0.5 10*3/uL (ref 0.1–1.0)
Monocytes Relative: 18 %
Neutro Abs: 1 10*3/uL — ABNORMAL LOW (ref 1.7–7.7)
Neutrophils Relative %: 32 %
Platelets: 284 10*3/uL (ref 150–400)
RBC: 5 MIL/uL (ref 3.87–5.11)
RDW: 13.5 % (ref 11.5–15.5)
WBC: 3 10*3/uL — ABNORMAL LOW (ref 4.0–10.5)
nRBC: 0 % (ref 0.0–0.2)

## 2022-03-26 LAB — SARS CORONAVIRUS 2 BY RT PCR: SARS Coronavirus 2 by RT PCR: POSITIVE — AB

## 2022-03-26 MED ORDER — IPRATROPIUM-ALBUTEROL 0.5-2.5 (3) MG/3ML IN SOLN
3.0000 mL | Freq: Once | RESPIRATORY_TRACT | Status: AC
  Administered 2022-03-26: 3 mL via RESPIRATORY_TRACT
  Filled 2022-03-26: qty 3

## 2022-03-26 MED ORDER — ALBUTEROL SULFATE HFA 108 (90 BASE) MCG/ACT IN AERS
2.0000 | INHALATION_SPRAY | RESPIRATORY_TRACT | Status: DC | PRN
  Filled 2022-03-26: qty 6.7

## 2022-03-26 MED ORDER — PREDNISONE 50 MG PO TABS
60.0000 mg | ORAL_TABLET | Freq: Once | ORAL | Status: AC
  Administered 2022-03-26: 60 mg via ORAL
  Filled 2022-03-26: qty 1

## 2022-03-26 MED ORDER — NIRMATRELVIR/RITONAVIR (PAXLOVID)TABLET
3.0000 | ORAL_TABLET | Freq: Two times a day (BID) | ORAL | 0 refills | Status: AC
  Filled 2022-03-26: qty 30, 5d supply, fill #0

## 2022-03-26 MED ORDER — PREDNISONE 20 MG PO TABS
40.0000 mg | ORAL_TABLET | Freq: Every day | ORAL | 0 refills | Status: AC
  Filled 2022-03-26: qty 8, 4d supply, fill #0

## 2022-03-26 NOTE — ED Triage Notes (Signed)
Covid + ON Tuesday, pt has been using her inhaler. She is sob at rest TODAY.

## 2022-03-26 NOTE — ED Notes (Signed)
Dc instructions reviewed with patient. Patient voiced understanding. Dc with belongings. Pt awaits pharmacy to deliver medications

## 2022-03-26 NOTE — ED Provider Notes (Signed)
[facility] Provider Note   CSN: [id number] Arrival date & time: 03/26/22  0701     History  Chief Complaint  Patient presents with   Shortness of Breath    Tamara Bowers is a 51 y.o. female.  Patient is a 51 year old female with a history of asthma who presents with shortness of breath and wheezing.  She said 2 to 3 days ago she started having some symptoms that she was concerned about COVID.  She had a headache associated with some nausea and chills.  She had a little bit of a cough.  Her COVID test was positive at home.  She has been using her inhaler but became more short of breath today.  Her cough is mostly nonproductive.  No known fevers.  No associated chest pain.  No vomiting or diarrhea.  She has been using her inhaler without improvement in symptoms.       Home Medications Prior to Admission medications   Medication Sig Start Date End Date Taking? Authorizing Provider  nirmatrelvir/ritonavir EUA (PAXLOVID) 20 x 150 MG & 10 x 100MG  TABS Take 3 tablets by mouth 2 (two) times daily for 5 days. 03/26/22 03/31/22 Yes Malvin Johns, MD  predniSONE (DELTASONE) 20 MG tablet 2 tabs po daily x 4 days 03/26/22  Yes Malvin Johns, MD  Loratadine (CLARITIN PO) Take by mouth.    [provider]  loratadine (CLARITIN) 10 MG tablet Take 10 mg by mouth daily. 02/25/22   [provider]  meloxicam (MOBIC) 7.5 MG tablet Take 1 tablet (7.5 mg total) by mouth daily. 07/12/18   Tacy Learn, PA-C  mometasone (NASONEX) 50 MCG/ACT nasal spray Place 2 sprays into the nose daily.    [provider]  montelukast (SINGULAIR) 10 MG tablet SMARTSIG:1 Tablet(s) By Mouth Every Evening 02/25/22   [provider]  Montelukast Sodium (SINGULAIR PO) Take by mouth.    [provider]  orphenadrine (NORFLEX) 100 MG tablet Take 1 tablet (100 mg total) by mouth 2 (two) times daily. 07/12/18   Tacy Learn, PA-C  prochlorperazine  (COMPAZINE) 10 MG tablet Take 1 tablet (10 mg total) by mouth 2 (two) times daily as needed for nausea or vomiting (or headache, may take with benadryl 25mg ). 02/15/22   Gareth Morgan, MD  SYMBICORT 160-4.5 MCG/ACT inhaler Inhale 2 puffs into the lungs 2 (two) times daily. 02/19/22   [provider]  zonisamide (ZONEGRAN) 25 MG capsule Take 100 mg by mouth at bedtime. 03/05/22   [provider]      Allergies    Other    Review of Systems   Review of Systems  Constitutional:  Positive for chills and fatigue. Negative for diaphoresis and fever.  HENT:  Negative for congestion, rhinorrhea and sneezing.   Eyes: Negative.   Respiratory:  Positive for cough, shortness of breath and wheezing. Negative for chest tightness.   Cardiovascular:  Negative for chest pain and leg swelling.  Gastrointestinal:  Negative for abdominal pain, blood in stool, diarrhea, nausea and vomiting.  Genitourinary:  Negative for difficulty urinating, flank pain, frequency and hematuria.  Musculoskeletal:  Negative for arthralgias and back pain.  Skin:  Negative for rash.  Neurological:  Negative for dizziness, speech difficulty, weakness, numbness and headaches.    Physical Exam Updated Vital Signs BP 117/78   Pulse 75   Temp 98.6 F (37 C) (Oral)   Resp (!) 26   SpO2 99%  Physical Exam Constitutional:  Appearance: She is well-developed.  HENT:     Head: Normocephalic and atraumatic.  Eyes:     Pupils: Pupils are equal, round, and reactive to light.  Cardiovascular:     Rate and Rhythm: Normal rate and regular rhythm.     Heart sounds: Normal heart sounds.  Pulmonary:     Effort: Pulmonary effort is normal. No respiratory distress.     Breath sounds: Wheezing present. No rales.  Chest:     Chest wall: No tenderness.  Abdominal:     General: Bowel sounds are normal.     Palpations: Abdomen is soft.     Tenderness: There is no abdominal tenderness. There is no guarding or  rebound.  Musculoskeletal:        General: Normal range of motion.     Cervical back: Normal range of motion and neck supple.     Right lower leg: No edema.     Left lower leg: No edema.  Lymphadenopathy:     Cervical: No cervical adenopathy.  Skin:    General: Skin is warm and dry.     Findings: No rash.  Neurological:     Mental Status: She is alert and oriented to person, place, and time.     ED Results / Procedures / Treatments   Labs (all labs ordered are listed, but only abnormal results are displayed) Labs Reviewed  SARS CORONAVIRUS 2 BY RT PCR - Abnormal; Notable for the following components:      Result Value   SARS Coronavirus 2 by RT PCR POSITIVE (*)    All other components within normal limits  BASIC METABOLIC PANEL - Abnormal; Notable for the following components:   Creatinine, Ser 1.09 (*)    All other components within normal limits  CBC WITH DIFFERENTIAL/PLATELET - Abnormal; Notable for the following components:   WBC 3.0 (*)    Neutro Abs 1.0 (*)    All other components within normal limits    EKG EKG Interpretation  Date/Time:  Thursday March 26 2022 07:32:30 EDT Ventricular Rate:  88 PR Interval:  143 QRS Duration: 104 QT Interval:  385 QTC Calculation: 466 R Axis:   36 Text Interpretation: Sinus rhythm RSR' in V1 or V2, right VCD or RVH since last tracing no significant change Confirmed by Rolan Bucco [phone]) on 03/26/2022 7:50:34 AM  Radiology DG Chest 2 View  Result Date: 03/26/2022 CLINICAL DATA:  Shortness of breath.  COVID POSITIVE. EXAM: CHEST - 2 VIEW COMPARISON:  Chest XR, 03/30/2015. FINDINGS: Cardiomediastinal silhouette is within normal limits. Lungs are well inflated. No focal consolidation or mass. No pleural effusion or pneumothorax. No acute displaced fracture. IMPRESSION: No acute cardiopulmonary process. Electronically Signed   By: Roanna Banning M.D.   On: 03/26/2022 08:08    Procedures Procedures    Medications Ordered  in ED Medications  albuterol (VENTOLIN HFA) 108 (90 Base) MCG/ACT inhaler 2 puff (has no administration in time range)  ipratropium-albuterol (DUONEB) 0.5-2.5 (3) MG/3ML nebulizer solution 3 mL (3 mLs Nebulization Given 03/26/22 0759)  predniSONE (DELTASONE) tablet 60 mg (60 mg Oral Given 03/26/22 5573)    ED Course/ Medical Decision Making/ A&P                           Medical Decision Making Amount and/or Complexity of Data Reviewed Labs: ordered. Radiology: ordered.  Risk Prescription drug management.   Patient is a 51 year old female who presents with URI symptoms.  COVID test was performed and is positive.  She also is having wheezing and symptoms consistent with an acute asthma exacerbation.  She was given an albuterol/Atrovent nebulizer treatment.  She was also given a dose of prednisone.  She is feeling better after this.  She has no hypoxia.  No increased work of breathing following the treatments.  Chest x-ray was performed which was interpreted by me and confirmed by the radiologist to show no evidence of pneumonia or pulmonary edema.  She is interested in starting antiviral medication for COVID.  Given her underlying asthma, I feel this is appropriate.  Labs were obtained which shows a normal GFR.  Her WBC count is low which is consistent with a viral infection.  At this point her symptoms have improved and there is no indication for hospitalization.  She was discharged home in good condition.  She was given a prescription for Paxlovid and a 4-day course of prednisone to start tomorrow.  Symptomatic care instructions and return precautions were given.  Final Clinical Impression(s) / ED Diagnoses Final diagnoses:  COVID-19 virus infection  Moderate persistent asthma with acute exacerbation    Rx / DC Orders ED Discharge Orders          Ordered    predniSONE (DELTASONE) 20 MG tablet        03/26/22 1005    nirmatrelvir/ritonavir EUA (PAXLOVID) 20 x 150 MG & 10 x 100MG  TABS   2 times daily       Note to Pharmacy: Take 3 tablets by mouth 2 (two) times daily for 5 days. Patient GFR is 60. Take nirmatrelvir (150 mg) two tablets twice daily for 5 days and ritonavir (100 mg) one tablet twice daily for 5 days.   03/26/22 1005              03/28/22, MD 03/26/22 1008

## 2022-03-26 NOTE — Discharge Instructions (Addendum)
You need to stay quarantine for the next 5 days.  You should be fever free for at least 48 hours and have improving symptoms prior to leaving isolation.  Use your inhaler 2 puffs every 4-6 hours for the next few days then as needed after that.  Make sure that you are drinking fluids.  Return to emergency room if you have any worsening symptoms.

## 2022-04-27 DIAGNOSIS — G518 Other disorders of facial nerve: Secondary | ICD-10-CM | POA: Diagnosis not present

## 2022-04-27 DIAGNOSIS — G43719 Chronic migraine without aura, intractable, without status migrainosus: Secondary | ICD-10-CM | POA: Diagnosis not present

## 2022-04-27 DIAGNOSIS — M542 Cervicalgia: Secondary | ICD-10-CM | POA: Diagnosis not present

## 2022-04-27 DIAGNOSIS — M791 Myalgia, unspecified site: Secondary | ICD-10-CM | POA: Diagnosis not present

## 2022-05-18 DIAGNOSIS — J4540 Moderate persistent asthma, uncomplicated: Secondary | ICD-10-CM | POA: Diagnosis not present

## 2022-05-18 DIAGNOSIS — J3089 Other allergic rhinitis: Secondary | ICD-10-CM | POA: Diagnosis not present

## 2022-05-18 DIAGNOSIS — J3081 Allergic rhinitis due to animal (cat) (dog) hair and dander: Secondary | ICD-10-CM | POA: Diagnosis not present

## 2022-05-18 DIAGNOSIS — J301 Allergic rhinitis due to pollen: Secondary | ICD-10-CM | POA: Diagnosis not present

## 2022-06-16 ENCOUNTER — Emergency Department (HOSPITAL_BASED_OUTPATIENT_CLINIC_OR_DEPARTMENT_OTHER): Payer: BC Managed Care – PPO | Admitting: Radiology

## 2022-06-16 ENCOUNTER — Other Ambulatory Visit (HOSPITAL_BASED_OUTPATIENT_CLINIC_OR_DEPARTMENT_OTHER): Payer: Self-pay

## 2022-06-16 ENCOUNTER — Encounter (HOSPITAL_BASED_OUTPATIENT_CLINIC_OR_DEPARTMENT_OTHER): Payer: Self-pay

## 2022-06-16 ENCOUNTER — Other Ambulatory Visit: Payer: Self-pay

## 2022-06-16 ENCOUNTER — Emergency Department (HOSPITAL_BASED_OUTPATIENT_CLINIC_OR_DEPARTMENT_OTHER)
Admission: EM | Admit: 2022-06-16 | Discharge: 2022-06-16 | Disposition: A | Discharge status: Home / Self Care | Payer: BC Managed Care – PPO | Attending: Emergency Medicine | Admitting: Emergency Medicine

## 2022-06-16 DIAGNOSIS — J40 Bronchitis, not specified as acute or chronic: Secondary | ICD-10-CM | POA: Diagnosis not present

## 2022-06-16 DIAGNOSIS — J45909 Unspecified asthma, uncomplicated: Secondary | ICD-10-CM | POA: Insufficient documentation

## 2022-06-16 DIAGNOSIS — Z7951 Long term (current) use of inhaled steroids: Secondary | ICD-10-CM | POA: Insufficient documentation

## 2022-06-16 DIAGNOSIS — J101 Influenza due to other identified influenza virus with other respiratory manifestations: Secondary | ICD-10-CM | POA: Diagnosis not present

## 2022-06-16 DIAGNOSIS — R0789 Other chest pain: Secondary | ICD-10-CM | POA: Diagnosis not present

## 2022-06-16 DIAGNOSIS — Z1152 Encounter for screening for COVID-19: Secondary | ICD-10-CM | POA: Insufficient documentation

## 2022-06-16 DIAGNOSIS — R059 Cough, unspecified: Secondary | ICD-10-CM | POA: Diagnosis not present

## 2022-06-16 DIAGNOSIS — R079 Chest pain, unspecified: Secondary | ICD-10-CM | POA: Diagnosis not present

## 2022-06-16 LAB — CBC
HCT: 38.1 % (ref 36.0–46.0)
Hemoglobin: 12.1 g/dL (ref 12.0–15.0)
MCH: 25.4 pg — ABNORMAL LOW (ref 26.0–34.0)
MCHC: 31.8 g/dL (ref 30.0–36.0)
MCV: 80 fL (ref 80.0–100.0)
Platelets: 323 10*3/uL (ref 150–400)
RBC: 4.76 MIL/uL (ref 3.87–5.11)
RDW: 14.6 % (ref 11.5–15.5)
WBC: 4.6 10*3/uL (ref 4.0–10.5)
nRBC: 0 % (ref 0.0–0.2)

## 2022-06-16 LAB — BASIC METABOLIC PANEL
Anion gap: 8 (ref 5–15)
BUN: 11 mg/dL (ref 6–20)
CO2: 24 mmol/L (ref 22–32)
Calcium: 9.1 mg/dL (ref 8.9–10.3)
Chloride: 104 mmol/L (ref 98–111)
Creatinine, Ser: 0.93 mg/dL (ref 0.44–1.00)
GFR, Estimated: >60 mL/min (ref >60)
Glucose, Bld: 101 mg/dL — ABNORMAL HIGH (ref 70–99)
Potassium: 3.9 mmol/L (ref 3.5–5.1)
Sodium: 136 mmol/L (ref 135–145)

## 2022-06-16 LAB — RESP PANEL BY RT-PCR (FLU A&B, COVID) ARPGX2
Influenza A by PCR: POSITIVE — AB
Influenza B by PCR: NEGATIVE
SARS Coronavirus 2 by RT PCR: NEGATIVE

## 2022-06-16 LAB — TROPONIN I (HIGH SENSITIVITY): Troponin I (High Sensitivity): <2 ng/L (ref <18)

## 2022-06-16 LAB — PREGNANCY, URINE: Preg Test, Ur: NEGATIVE

## 2022-06-16 MED ORDER — PREDNISONE 10 MG PO TABS
ORAL_TABLET | ORAL | 0 refills | Status: DC
  Filled 2022-06-16: qty 21, 6d supply, fill #0

## 2022-06-16 MED ORDER — OSELTAMIVIR PHOSPHATE 75 MG PO CAPS
75.0000 mg | ORAL_CAPSULE | Freq: Two times a day (BID) | ORAL | 0 refills | Status: DC
  Filled 2022-06-16: qty 10, 5d supply, fill #0

## 2022-06-16 NOTE — ED Notes (Signed)
Dr Sheldon at bedside  

## 2022-06-16 NOTE — ED Provider Notes (Signed)
[facility]  Provider Note  CSN: [id number] Arrival date & time: 06/16/22 0453  History Chief Complaint  Patient presents with   Chest Pain    Tamara Bowers is a 50 y.o. female with history of asthma reports 2 days of cough, congestion and chest tightness. She has used her home inhalers with some improvement. No fever. She had a friend who visited her earlier that day who had a cough.    Home Medications Prior to Admission medications   Medication Sig Start Date End Date Taking? Authorizing Provider  oseltamivir (TAMIFLU) 75 MG capsule Take 1 capsule (75 mg total) by mouth every 12 (twelve) hours. 06/16/22  Yes Truddie Hidden, MD  predniSONE (STERAPRED UNI-PAK 21 TAB) 10 MG (21) TBPK tablet 10mg  Tabs, 6 day taper. Use as directed 06/16/22  Yes Truddie Hidden, MD  Loratadine (CLARITIN PO) Take by mouth.    [provider]  loratadine (CLARITIN) 10 MG tablet Take 10 mg by mouth daily. 02/25/22   [provider]  meloxicam (MOBIC) 7.5 MG tablet Take 1 tablet (7.5 mg total) by mouth daily. 07/12/18   Tacy Learn, PA-C  mometasone (NASONEX) 50 MCG/ACT nasal spray Place 2 sprays into the nose daily.    [provider]  montelukast (SINGULAIR) 10 MG tablet SMARTSIG:1 Tablet(s) By Mouth Every Evening 02/25/22   [provider]  Montelukast Sodium (SINGULAIR PO) Take by mouth.    [provider]  orphenadrine (NORFLEX) 100 MG tablet Take 1 tablet (100 mg total) by mouth 2 (two) times daily. 07/12/18   Tacy Learn, PA-C  prochlorperazine (COMPAZINE) 10 MG tablet Take 1 tablet (10 mg total) by mouth 2 (two) times daily as needed for nausea or vomiting (or headache, may take with benadryl 25mg ). 02/15/22   Gareth Morgan, MD  SYMBICORT 160-4.5 MCG/ACT inhaler Inhale 2 puffs into the lungs 2 (two) times daily. 02/19/22   [provider]  zonisamide (ZONEGRAN) 25 MG capsule Take 100 mg by mouth at  bedtime. 03/05/22   [provider]     Allergies    Other   Review of Systems   Review of Systems Please see HPI for pertinent positives and negatives  Physical Exam BP (!) 145/80   Pulse (!) 105   Temp 99.5 F (37.5 C) (Oral)   Resp 19   Ht 5\' 4"  (1.626 m)   Wt 79.4 kg   SpO2 96%   BMI 30.04 kg/m   Physical Exam Vitals and nursing note reviewed.  Constitutional:      Appearance: Normal appearance.  HENT:     Head: Normocephalic and atraumatic.     Nose: Nose normal.     Mouth/Throat:     Mouth: Mucous membranes are moist.  Eyes:     Extraocular Movements: Extraocular movements intact.     Conjunctiva/sclera: Conjunctivae normal.  Cardiovascular:     Rate and Rhythm: Normal rate.  Pulmonary:     Effort: Pulmonary effort is normal.     Breath sounds: Normal breath sounds. No wheezing, rhonchi or rales.  Abdominal:     General: Abdomen is flat.     Palpations: Abdomen is soft.     Tenderness: There is no abdominal tenderness.  Musculoskeletal:        General: No swelling. Normal range of motion.     Cervical back: Neck supple.     Right lower leg: No edema.     Left lower leg: No  edema.  Skin:    General: Skin is warm and dry.  Neurological:     General: No focal deficit present.     Mental Status: She is alert.  Psychiatric:        Mood and Affect: Mood normal.     ED Results / Procedures / Treatments   EKG EKG Interpretation  Date/Time:  Tuesday June 16 2022 05:04:16 EST Ventricular Rate:  116 PR Interval:  142 QRS Duration: 94 QT Interval:  330 QTC Calculation: 458 R Axis:   63 Text Interpretation: Sinus tachycardia Incomplete right bundle branch block Nonspecific T wave abnormality Abnormal ECG When compared with ECG of 26-Mar-2022 07:32, PREVIOUS ECG IS PRESENT Rate faster Confirmed by Susy Frizzle [phone]) on 06/16/2022 5:10:23 AM  Procedures Procedures  Medications Ordered in the ED Medications - No data to  display  Initial Impression and Plan  Patient with known asthma, reports pulm recently changed her maintenance inhaler to Trelegy but she has not gotten it filled yet. Here with URI symptoms and chest pressure, low concern for ACS but will check labs. CXR and nasal swab. Currently not in distress, clear lungs with normal SpO2.   ED Course   Clinical Course as of 06/16/22 7062  Tue Jun 16, 2022  3762 I personally viewed the images from radiology studies and agree with radiologist interpretation: CXR is clear.  [CS]  0545 CBC is normal.  [CS]  0629 BMP and Trop are normal. Nasal swab is positive for influenza. Given her comorbidities and symptoms <48hrs she may benefit from Tamiflu. Will also give pred-pak for her cough/bronchitis. Recommend isolation, hand hygiene, OTC supportive and symptomatic care. PCP follow up. RTED for any worsening or other concerns.  [CS]    Clinical Course User Index [CS] Pollyann Savoy, MD     MDM Rules/Calculators/A&P Medical Decision Making Given presenting complaint, I considered that admission might be necessary. After review of results from ED lab and/or imaging studies, admission to the hospital is not indicated at this time.    Problems Addressed: Bronchitis: acute illness or injury Influenza A: acute illness or injury  Amount and/or Complexity of Data Reviewed Labs: ordered. Decision-making details documented in ED Course. Radiology: ordered and independent interpretation performed. Decision-making details documented in ED Course. ECG/medicine tests: ordered and independent interpretation performed. Decision-making details documented in ED Course.  Risk Prescription drug management. Decision regarding hospitalization.    Final Clinical Impression(s) / ED Diagnoses Final diagnoses:  Influenza A  Bronchitis    Rx / DC Orders ED Discharge Orders          Ordered    oseltamivir (TAMIFLU) 75 MG capsule  Every 12 hours        06/16/22  0632    predniSONE (STERAPRED UNI-PAK 21 TAB) 10 MG (21) TBPK tablet        06/16/22 8315             Pollyann Savoy, MD 06/16/22 [phone]

## 2022-06-16 NOTE — ED Triage Notes (Signed)
Chest pain and cough since last night.  Pain is described as pressure.

## 2022-06-23 ENCOUNTER — Emergency Department (HOSPITAL_BASED_OUTPATIENT_CLINIC_OR_DEPARTMENT_OTHER)
Admission: EM | Admit: 2022-06-23 | Discharge: 2022-06-23 | Disposition: A | Discharge status: Home / Self Care | Payer: BC Managed Care – PPO | Attending: Emergency Medicine | Admitting: Emergency Medicine

## 2022-06-23 ENCOUNTER — Other Ambulatory Visit: Payer: Self-pay

## 2022-06-23 DIAGNOSIS — E860 Dehydration: Secondary | ICD-10-CM | POA: Diagnosis not present

## 2022-06-23 DIAGNOSIS — R339 Retention of urine, unspecified: Secondary | ICD-10-CM | POA: Insufficient documentation

## 2022-06-23 LAB — CBC WITH DIFFERENTIAL/PLATELET
Abs Immature Granulocytes: 0.2 10*3/uL — ABNORMAL HIGH (ref 0.00–0.07)
Basophils Absolute: 0.1 10*3/uL (ref 0.0–0.1)
Basophils Relative: 1 %
Eosinophils Absolute: 0.1 10*3/uL (ref 0.0–0.5)
Eosinophils Relative: 1 %
HCT: 36.2 % (ref 36.0–46.0)
Hemoglobin: 11.7 g/dL — ABNORMAL LOW (ref 12.0–15.0)
Immature Granulocytes: 3 %
Lymphocytes Relative: 49 %
Lymphs Abs: 3.9 10*3/uL (ref 0.7–4.0)
MCH: 25.8 pg — ABNORMAL LOW (ref 26.0–34.0)
MCHC: 32.3 g/dL (ref 30.0–36.0)
MCV: 79.9 fL — ABNORMAL LOW (ref 80.0–100.0)
Monocytes Absolute: 0.6 10*3/uL (ref 0.1–1.0)
Monocytes Relative: 8 %
Neutro Abs: 3 10*3/uL (ref 1.7–7.7)
Neutrophils Relative %: 38 %
Platelets: 322 10*3/uL (ref 150–400)
RBC: 4.53 MIL/uL (ref 3.87–5.11)
RDW: 14.4 % (ref 11.5–15.5)
WBC: 7.9 10*3/uL (ref 4.0–10.5)
nRBC: 0 % (ref 0.0–0.2)

## 2022-06-23 LAB — BASIC METABOLIC PANEL
Anion gap: 4 — ABNORMAL LOW (ref 5–15)
BUN: 17 mg/dL (ref 6–20)
CO2: 29 mmol/L (ref 22–32)
Calcium: 8.8 mg/dL — ABNORMAL LOW (ref 8.9–10.3)
Chloride: 105 mmol/L (ref 98–111)
Creatinine, Ser: 0.97 mg/dL (ref 0.44–1.00)
GFR, Estimated: >60 mL/min (ref >60)
Glucose, Bld: 80 mg/dL (ref 70–99)
Potassium: 3.5 mmol/L (ref 3.5–5.1)
Sodium: 138 mmol/L (ref 135–145)

## 2022-06-23 LAB — URINALYSIS, ROUTINE W REFLEX MICROSCOPIC
Bilirubin Urine: NEGATIVE
Glucose, UA: NEGATIVE mg/dL
Hgb urine dipstick: NEGATIVE
Ketones, ur: NEGATIVE mg/dL
Leukocytes,Ua: NEGATIVE
Nitrite: NEGATIVE
Protein, ur: NEGATIVE mg/dL
Specific Gravity, Urine: 1.015 (ref 1.005–1.030)
pH: 5.5 (ref 5.0–8.0)

## 2022-06-23 LAB — PREGNANCY, URINE: Preg Test, Ur: NEGATIVE

## 2022-06-23 MED ORDER — BENZONATATE 100 MG PO CAPS: 100.0000 mg | ORAL_CAPSULE | Freq: Three times a day (TID) | ORAL | 0 refills | Status: DC

## 2022-06-23 MED ORDER — LACTATED RINGERS IV BOLUS
1000.0000 mL | Freq: Once | INTRAVENOUS | Status: AC
  Administered 2022-06-23: 1000 mL via INTRAVENOUS

## 2022-06-23 NOTE — ED Notes (Signed)
Dc instructions and scripts reviewed with pt no questions or concerns at this time. Will follow up with pcp.  

## 2022-06-23 NOTE — Discharge Instructions (Signed)
Your workup today was reassuring.  You voided in the emergency room multiple times.  I feel that your decreased urinary output is linked to dehydration.  Increase your fluid intake.  Your workup showed normal renal function and no evidence of UTI.  If any concerning symptoms return to the emergency room otherwise follow-up with your primary care provider.  If you do not have a primary care provider appetizer information for 1 above.

## 2022-06-23 NOTE — ED Provider Notes (Signed)
[facility] EMERGENCY DEPARTMENT Provider Note   CSN: [id number] Arrival date & time: 06/23/22  1207     History  Chief Complaint  Patient presents with   Urinary Retention    Tamara Bowers is a 51 y.o. female.  51 year old female presents today for concern of urinary retention.  She states she has been sick over the past week with bronchitis and flu and was treated with Tamiflu and prednisone.  She states since last night she has had decreased urine output.  She states she felt that she has been hydrating adequately.  Has slight right flank pain.  No prior history of kidney stones.  Denies abdominal pain, dysuria, or other complaints.  Denies nausea or vomiting.   The history is provided by the patient. No language interpreter was used.       Home Medications Prior to Admission medications   Medication Sig Start Date End Date Taking? Authorizing Provider  Loratadine (CLARITIN PO) Take by mouth.    [provider]  loratadine (CLARITIN) 10 MG tablet Take 10 mg by mouth daily. 02/25/22   [provider]  meloxicam (MOBIC) 7.5 MG tablet Take 1 tablet (7.5 mg total) by mouth daily. 07/12/18   Jeannie Fend, PA-C  mometasone (NASONEX) 50 MCG/ACT nasal spray Place 2 sprays into the nose daily.    [provider]  montelukast (SINGULAIR) 10 MG tablet SMARTSIG:1 Tablet(s) By Mouth Every Evening 02/25/22   [provider]  Montelukast Sodium (SINGULAIR PO) Take by mouth.    [provider]  orphenadrine (NORFLEX) 100 MG tablet Take 1 tablet (100 mg total) by mouth 2 (two) times daily. 07/12/18   Jeannie Fend, PA-C  oseltamivir (TAMIFLU) 75 MG capsule Take 1 capsule (75 mg total) by mouth every 12 (twelve) hours. 06/16/22   Pollyann Savoy, MD  predniSONE (DELTASONE) 10 MG tablet Take as directed on attached sheet. 06/16/22   Pollyann Savoy, MD  prochlorperazine (COMPAZINE) 10 MG tablet Take 1 tablet (10 mg total) by mouth  2 (two) times daily as needed for nausea or vomiting (or headache, may take with benadryl 25mg ). 02/15/22   04/17/22, MD  SYMBICORT 160-4.5 MCG/ACT inhaler Inhale 2 puffs into the lungs 2 (two) times daily. 02/19/22   [provider]  zonisamide (ZONEGRAN) 25 MG capsule Take 100 mg by mouth at bedtime. 03/05/22   [provider]      Allergies    Other    Review of Systems   Review of Systems  Constitutional:  Negative for chills and fever.  Gastrointestinal:  Negative for abdominal pain, nausea and vomiting.  Genitourinary:  Positive for flank pain. Negative for dysuria.  Neurological:  Negative for light-headedness.  All other systems reviewed and are negative.   Physical Exam Updated Vital Signs BP 134/77 (BP Location: Right Arm)   Pulse 79   Temp 97.9 F (36.6 C) (Oral)   Resp 18   SpO2 99%  Physical Exam Vitals and nursing note reviewed.  Constitutional:      General: She is not in acute distress.    Appearance: Normal appearance. She is not ill-appearing.  HENT:     Head: Normocephalic and atraumatic.     Nose: Nose normal.  Eyes:     General: No scleral icterus.    Extraocular Movements: Extraocular movements intact.     Conjunctiva/sclera: Conjunctivae normal.  Cardiovascular:     Rate and Rhythm: Normal rate and regular rhythm.  Pulses: Normal pulses.  Pulmonary:     Effort: Pulmonary effort is normal. No respiratory distress.     Breath sounds: Normal breath sounds. No wheezing or rales.  Abdominal:     General: There is no distension.     Palpations: Abdomen is soft.     Tenderness: There is no abdominal tenderness. There is right CVA tenderness. There is no left CVA tenderness or guarding.  Musculoskeletal:        General: Normal range of motion.     Cervical back: Normal range of motion.     Right lower leg: No edema.     Left lower leg: No edema.  Skin:    General: Skin is warm and dry.  Neurological:     General: No focal  deficit present.     Mental Status: She is alert. Mental status is at baseline.     ED Results / Procedures / Treatments   Labs (all labs ordered are listed, but only abnormal results are displayed) Labs Reviewed  URINALYSIS, ROUTINE W REFLEX MICROSCOPIC  PREGNANCY, URINE  CBC WITH DIFFERENTIAL/PLATELET  BASIC METABOLIC PANEL    EKG None  Radiology No results found.  Procedures Procedures    Medications Ordered in ED Medications  lactated ringers bolus 1,000 mL (has no administration in time range)    ED Course/ Medical Decision Making/ A&P                           Medical Decision Making Amount and/or Complexity of Data Reviewed Labs: ordered.   Medical Decision Making / ED Course   This patient presents to the ED for concern of urinary retention, this involves an extensive number of treatment options, and is a complaint that carries with it a high risk of complications and morbidity.  The differential diagnosis includes outlet obstruction, AKI, dehydration  MDM: 51 year old female presents today for evaluation of concern for decreased urinary output.  States symptoms present to the emergency room she had minimal output.  She did have some output in the emergency room.  Overall she is well-appearing.  Abdomen is soft nontender and without distention.  Bladder scan performed that shows 47 mL.  Will obtain CBC, BMP.  UA was obtained and does not show evidence of UTI.  Will provide fluid bolus as well. CBC shows hemoglobin of 11.7 otherwise unremarkable.  BMP shows preserved renal function, normal electrolytes.  On reevaluation after receiving 300 cc of fluid patient stated she had to go void again.  Considered obtaining a CT abdomen pelvis however patient has been voiding while she has been in the emergency room.  Without abdominal distention.  And normal renal function.  Will defer today.  Feels like there is no emergent cause for decreased urinary output.  Likely  dehydration.  Discussed increased hydration.  Patient requesting to have [place] for her URI.  Will send these in.  Patient is appropriate for discharge.  Discharged in stable condition.  Return precaution discussed.  Patient voices understanding and is in agreement with the plan.    Lab Tests: -I ordered, reviewed, and interpreted labs.   The pertinent results include:   Labs Reviewed  URINALYSIS, ROUTINE W REFLEX MICROSCOPIC  PREGNANCY, URINE  CBC WITH DIFFERENTIAL/PLATELET  BASIC METABOLIC PANEL      EKG  EKG Interpretation  Date/Time:    Ventricular Rate:    PR Interval:    QRS Duration:   QT Interval:  QTC Calculation:   R Axis:     Text Interpretation:           Medicines ordered and prescription drug management: Meds ordered this encounter  Medications   lactated ringers bolus 1,000 mL    -I have reviewed the patients home medicines and have made adjustments as needed  Reevaluation: After the interventions noted above, I reevaluated the patient and found that they have :improved  Co morbidities that complicate the patient evaluation  Past Medical History:  Diagnosis Date   Asthma       Dispostion: Indication for admission or additional workup.  She is appropriate for discharge.  Final Clinical Impression(s) / ED Diagnoses Final diagnoses:  Dehydration    Rx / DC Orders ED Discharge Orders          Ordered    benzonatate (TESSALON) 100 MG capsule  Every 8 hours        06/23/22 1521              Evlyn Courier, PA-C 06/23/22 1521    Blanchie Dessert, MD 06/24/22 2105

## 2022-06-23 NOTE — ED Notes (Signed)
Bladder scan = 47ml

## 2022-06-23 NOTE — ED Triage Notes (Signed)
Pt recently completed prescription of prednisone and tamiflu and states has been drinking a lot to stay hydrated but output is not close to the amount she is drinking. Pt states has urinary urgency with little output as well as back pain.

## 2022-08-04 DIAGNOSIS — G43719 Chronic migraine without aura, intractable, without status migrainosus: Secondary | ICD-10-CM | POA: Diagnosis not present

## 2022-08-04 DIAGNOSIS — M542 Cervicalgia: Secondary | ICD-10-CM | POA: Diagnosis not present

## 2022-08-04 DIAGNOSIS — M791 Myalgia, unspecified site: Secondary | ICD-10-CM | POA: Diagnosis not present

## 2022-08-04 DIAGNOSIS — G518 Other disorders of facial nerve: Secondary | ICD-10-CM | POA: Diagnosis not present

## 2022-09-07 ENCOUNTER — Other Ambulatory Visit: Payer: Self-pay

## 2022-09-11 DIAGNOSIS — G43719 Chronic migraine without aura, intractable, without status migrainosus: Secondary | ICD-10-CM | POA: Diagnosis not present

## 2022-09-11 DIAGNOSIS — M791 Myalgia, unspecified site: Secondary | ICD-10-CM | POA: Diagnosis not present

## 2022-09-11 DIAGNOSIS — G518 Other disorders of facial nerve: Secondary | ICD-10-CM | POA: Diagnosis not present

## 2022-09-11 DIAGNOSIS — M542 Cervicalgia: Secondary | ICD-10-CM | POA: Diagnosis not present

## 2022-10-28 DIAGNOSIS — J3089 Other allergic rhinitis: Secondary | ICD-10-CM | POA: Diagnosis not present

## 2022-10-28 DIAGNOSIS — J301 Allergic rhinitis due to pollen: Secondary | ICD-10-CM | POA: Diagnosis not present

## 2022-10-28 DIAGNOSIS — J4540 Moderate persistent asthma, uncomplicated: Secondary | ICD-10-CM | POA: Diagnosis not present

## 2022-10-28 DIAGNOSIS — J393 Upper respiratory tract hypersensitivity reaction, site unspecified: Secondary | ICD-10-CM | POA: Diagnosis not present

## 2022-10-28 DIAGNOSIS — J3081 Allergic rhinitis due to animal (cat) (dog) hair and dander: Secondary | ICD-10-CM | POA: Diagnosis not present

## 2023-01-04 DIAGNOSIS — X58XXXA Exposure to other specified factors, initial encounter: Secondary | ICD-10-CM | POA: Diagnosis not present

## 2023-01-04 DIAGNOSIS — S39012A Strain of muscle, fascia and tendon of lower back, initial encounter: Secondary | ICD-10-CM | POA: Diagnosis not present

## 2023-01-04 DIAGNOSIS — R531 Weakness: Secondary | ICD-10-CM | POA: Diagnosis not present

## 2023-01-04 DIAGNOSIS — R350 Frequency of micturition: Secondary | ICD-10-CM | POA: Diagnosis not present

## 2023-01-04 DIAGNOSIS — R399 Unspecified symptoms and signs involving the genitourinary system: Secondary | ICD-10-CM | POA: Diagnosis not present

## 2023-01-19 DIAGNOSIS — G43719 Chronic migraine without aura, intractable, without status migrainosus: Secondary | ICD-10-CM | POA: Diagnosis not present

## 2023-01-19 DIAGNOSIS — M791 Myalgia, unspecified site: Secondary | ICD-10-CM | POA: Diagnosis not present

## 2023-01-19 DIAGNOSIS — G518 Other disorders of facial nerve: Secondary | ICD-10-CM | POA: Diagnosis not present

## 2023-01-19 DIAGNOSIS — M542 Cervicalgia: Secondary | ICD-10-CM | POA: Diagnosis not present

## 2023-03-04 DIAGNOSIS — N912 Amenorrhea, unspecified: Secondary | ICD-10-CM | POA: Diagnosis not present

## 2023-03-04 DIAGNOSIS — R232 Flushing: Secondary | ICD-10-CM | POA: Diagnosis not present

## 2023-03-04 DIAGNOSIS — Z30431 Encounter for routine checking of intrauterine contraceptive device: Secondary | ICD-10-CM | POA: Diagnosis not present

## 2023-03-04 DIAGNOSIS — Z01419 Encounter for gynecological examination (general) (routine) without abnormal findings: Secondary | ICD-10-CM | POA: Diagnosis not present

## 2023-04-22 DIAGNOSIS — J4540 Moderate persistent asthma, uncomplicated: Secondary | ICD-10-CM | POA: Insufficient documentation

## 2023-04-22 DIAGNOSIS — J301 Allergic rhinitis due to pollen: Secondary | ICD-10-CM | POA: Insufficient documentation

## 2023-04-22 DIAGNOSIS — M545 Low back pain, unspecified: Secondary | ICD-10-CM | POA: Diagnosis not present

## 2023-04-22 DIAGNOSIS — H1045 Other chronic allergic conjunctivitis: Secondary | ICD-10-CM | POA: Insufficient documentation

## 2023-04-22 DIAGNOSIS — R399 Unspecified symptoms and signs involving the genitourinary system: Secondary | ICD-10-CM | POA: Diagnosis not present

## 2023-04-22 DIAGNOSIS — M5442 Lumbago with sciatica, left side: Secondary | ICD-10-CM | POA: Diagnosis not present

## 2023-04-22 DIAGNOSIS — M4727 Other spondylosis with radiculopathy, lumbosacral region: Secondary | ICD-10-CM | POA: Diagnosis not present

## 2023-04-22 DIAGNOSIS — R1084 Generalized abdominal pain: Secondary | ICD-10-CM | POA: Diagnosis not present

## 2023-04-22 DIAGNOSIS — M5441 Lumbago with sciatica, right side: Secondary | ICD-10-CM | POA: Diagnosis not present

## 2023-04-22 DIAGNOSIS — J3081 Allergic rhinitis due to animal (cat) (dog) hair and dander: Secondary | ICD-10-CM | POA: Insufficient documentation

## 2023-04-22 DIAGNOSIS — M4126 Other idiopathic scoliosis, lumbar region: Secondary | ICD-10-CM | POA: Diagnosis not present

## 2023-04-22 DIAGNOSIS — R519 Headache, unspecified: Secondary | ICD-10-CM | POA: Diagnosis not present

## 2023-04-22 DIAGNOSIS — K5900 Constipation, unspecified: Secondary | ICD-10-CM | POA: Diagnosis not present

## 2023-04-30 ENCOUNTER — Other Ambulatory Visit: Payer: Self-pay

## 2023-04-30 ENCOUNTER — Emergency Department (HOSPITAL_COMMUNITY)
Admission: EM | Admit: 2023-04-30 | Discharge: 2023-04-30 | Discharge status: Against Medical Advice | Payer: BC Managed Care – PPO | Attending: Emergency Medicine | Admitting: Emergency Medicine

## 2023-04-30 ENCOUNTER — Encounter (HOSPITAL_COMMUNITY): Payer: Self-pay

## 2023-04-30 DIAGNOSIS — R109 Unspecified abdominal pain: Secondary | ICD-10-CM | POA: Diagnosis not present

## 2023-04-30 DIAGNOSIS — R61 Generalized hyperhidrosis: Secondary | ICD-10-CM | POA: Diagnosis not present

## 2023-04-30 DIAGNOSIS — I959 Hypotension, unspecified: Secondary | ICD-10-CM | POA: Diagnosis not present

## 2023-04-30 DIAGNOSIS — Z5321 Procedure and treatment not carried out due to patient leaving prior to being seen by health care provider: Secondary | ICD-10-CM | POA: Insufficient documentation

## 2023-04-30 DIAGNOSIS — R1084 Generalized abdominal pain: Secondary | ICD-10-CM | POA: Diagnosis not present

## 2023-04-30 DIAGNOSIS — R110 Nausea: Secondary | ICD-10-CM | POA: Diagnosis not present

## 2023-04-30 LAB — LIPASE, BLOOD: Lipase: 30 U/L (ref 11–51)

## 2023-04-30 LAB — COMPREHENSIVE METABOLIC PANEL
ALT: 19 U/L (ref 0–44)
AST: 17 U/L (ref 15–41)
Albumin: 4.4 g/dL (ref 3.5–5.0)
Alkaline Phosphatase: 50 U/L (ref 38–126)
Anion gap: 11 (ref 5–15)
BUN: 19 mg/dL (ref 6–20)
CO2: 28 mmol/L (ref 22–32)
Calcium: 9.3 mg/dL (ref 8.9–10.3)
Chloride: 100 mmol/L (ref 98–111)
Creatinine, Ser: 1.22 mg/dL — ABNORMAL HIGH (ref 0.44–1.00)
GFR, Estimated: 54 mL/min — ABNORMAL LOW (ref >60)
Glucose, Bld: 112 mg/dL — ABNORMAL HIGH (ref 70–99)
Potassium: 3.1 mmol/L — ABNORMAL LOW (ref 3.5–5.1)
Sodium: 139 mmol/L (ref 135–145)
Total Bilirubin: 0.6 mg/dL (ref 0.3–1.2)
Total Protein: 7.5 g/dL (ref 6.5–8.1)

## 2023-04-30 LAB — CBC
HCT: 42.3 % (ref 36.0–46.0)
Hemoglobin: 13.5 g/dL (ref 12.0–15.0)
MCH: 26.4 pg (ref 26.0–34.0)
MCHC: 31.9 g/dL (ref 30.0–36.0)
MCV: 82.6 fL (ref 80.0–100.0)
Platelets: 351 10*3/uL (ref 150–400)
RBC: 5.12 MIL/uL — ABNORMAL HIGH (ref 3.87–5.11)
RDW: 13.5 % (ref 11.5–15.5)
WBC: 10.8 10*3/uL — ABNORMAL HIGH (ref 4.0–10.5)
nRBC: 0 % (ref 0.0–0.2)

## 2023-04-30 LAB — HCG, SERUM, QUALITATIVE: Preg, Serum: NEGATIVE

## 2023-04-30 NOTE — ED Triage Notes (Signed)
Per EMS, pt was eating when she suddenly have severe abd pain. Pt does endorse nausea, diaphoresis, and a near LOC episode.

## 2023-08-17 DIAGNOSIS — N3000 Acute cystitis without hematuria: Secondary | ICD-10-CM | POA: Diagnosis not present

## 2023-08-17 DIAGNOSIS — R399 Unspecified symptoms and signs involving the genitourinary system: Secondary | ICD-10-CM | POA: Diagnosis not present

## 2023-08-25 ENCOUNTER — Other Ambulatory Visit (HOSPITAL_BASED_OUTPATIENT_CLINIC_OR_DEPARTMENT_OTHER): Payer: Self-pay

## 2023-08-25 ENCOUNTER — Other Ambulatory Visit: Payer: Self-pay

## 2023-08-25 ENCOUNTER — Encounter (HOSPITAL_BASED_OUTPATIENT_CLINIC_OR_DEPARTMENT_OTHER): Payer: Self-pay | Admitting: Emergency Medicine

## 2023-08-25 ENCOUNTER — Emergency Department (HOSPITAL_BASED_OUTPATIENT_CLINIC_OR_DEPARTMENT_OTHER): Payer: BC Managed Care – PPO

## 2023-08-25 ENCOUNTER — Emergency Department (HOSPITAL_BASED_OUTPATIENT_CLINIC_OR_DEPARTMENT_OTHER)
Admission: EM | Admit: 2023-08-25 | Discharge: 2023-08-25 | Disposition: A | Discharge status: Home / Self Care | Payer: BC Managed Care – PPO | Attending: Emergency Medicine | Admitting: Emergency Medicine

## 2023-08-25 ENCOUNTER — Other Ambulatory Visit (HOSPITAL_COMMUNITY): Payer: Self-pay

## 2023-08-25 DIAGNOSIS — R519 Headache, unspecified: Secondary | ICD-10-CM | POA: Diagnosis not present

## 2023-08-25 DIAGNOSIS — R109 Unspecified abdominal pain: Secondary | ICD-10-CM | POA: Insufficient documentation

## 2023-08-25 DIAGNOSIS — R197 Diarrhea, unspecified: Secondary | ICD-10-CM | POA: Insufficient documentation

## 2023-08-25 DIAGNOSIS — J45909 Unspecified asthma, uncomplicated: Secondary | ICD-10-CM | POA: Diagnosis not present

## 2023-08-25 LAB — COMPREHENSIVE METABOLIC PANEL
ALT: 19 U/L (ref 0–44)
AST: 21 U/L (ref 15–41)
Albumin: 4 g/dL (ref 3.5–5.0)
Alkaline Phosphatase: 39 U/L (ref 38–126)
Anion gap: 7 (ref 5–15)
BUN: 5 mg/dL — ABNORMAL LOW (ref 6–20)
CO2: 27 mmol/L (ref 22–32)
Calcium: 8.7 mg/dL — ABNORMAL LOW (ref 8.9–10.3)
Chloride: 103 mmol/L (ref 98–111)
Creatinine, Ser: 0.93 mg/dL (ref 0.44–1.00)
GFR, Estimated: >60 mL/min (ref >60)
Glucose, Bld: 84 mg/dL (ref 70–99)
Potassium: 3.6 mmol/L (ref 3.5–5.1)
Sodium: 137 mmol/L (ref 135–145)
Total Bilirubin: 0.4 mg/dL (ref 0.0–1.2)
Total Protein: 6.7 g/dL (ref 6.5–8.1)

## 2023-08-25 LAB — PREGNANCY, URINE: Preg Test, Ur: NEGATIVE

## 2023-08-25 LAB — CBC
HCT: 41 % (ref 36.0–46.0)
Hemoglobin: 13 g/dL (ref 12.0–15.0)
MCH: 25.2 pg — ABNORMAL LOW (ref 26.0–34.0)
MCHC: 31.7 g/dL (ref 30.0–36.0)
MCV: 79.6 fL — ABNORMAL LOW (ref 80.0–100.0)
Platelets: 307 10*3/uL (ref 150–400)
RBC: 5.15 MIL/uL — ABNORMAL HIGH (ref 3.87–5.11)
RDW: 14 % (ref 11.5–15.5)
WBC: 3.7 10*3/uL — ABNORMAL LOW (ref 4.0–10.5)
nRBC: 0 % (ref 0.0–0.2)

## 2023-08-25 LAB — RESP PANEL BY RT-PCR (RSV, FLU A&B, COVID)  RVPGX2
Influenza A by PCR: NEGATIVE
Influenza B by PCR: NEGATIVE
Resp Syncytial Virus by PCR: NEGATIVE
SARS Coronavirus 2 by RT PCR: NEGATIVE

## 2023-08-25 LAB — URINALYSIS, ROUTINE W REFLEX MICROSCOPIC
Bilirubin Urine: NEGATIVE
Glucose, UA: NEGATIVE mg/dL
Hgb urine dipstick: NEGATIVE
Ketones, ur: NEGATIVE mg/dL
Leukocytes,Ua: NEGATIVE
Nitrite: NEGATIVE
Protein, ur: NEGATIVE mg/dL
Specific Gravity, Urine: 1.009 (ref 1.005–1.030)
pH: 5.5 (ref 5.0–8.0)

## 2023-08-25 LAB — LIPASE, BLOOD: Lipase: 10 U/L — ABNORMAL LOW (ref 11–51)

## 2023-08-25 MED ORDER — SUCRALFATE 1 G PO TABS
1.0000 g | ORAL_TABLET | Freq: Three times a day (TID) | ORAL | 0 refills | Status: DC
  Filled 2023-08-25: qty 11, 4d supply, fill #0
  Filled 2023-08-25: qty 10, 3d supply, fill #0

## 2023-08-25 MED ORDER — IOHEXOL 300 MG/ML  SOLN
100.0000 mL | Freq: Once | INTRAMUSCULAR | Status: AC | PRN
  Administered 2023-08-25: 100 mL via INTRAVENOUS

## 2023-08-25 NOTE — ED Provider Notes (Signed)
 [place] EMERGENCY DEPARTMENT AT [facility] Provider Note  CSN: [id number] Arrival date & time: 08/25/23 7829  Chief Complaint(s) Diarrhea  HPI Tamara Bowers is a 53 y.o. female with past medical history as below, significant for asthma who presents to the ED with complaint of abdominal pain, diarrhea  She was started on Macrobid for UTI, she completed 5 days of the Macrobid and she began having abdominal cramping, diarrhea, headaches.  She stopped taking her Macrobid 2 days ago.  Diarrhea Abdominal symptoms persisted.  No fevers or chills, no nausea or vomiting.  She is tolerating p.o. without difficulty.  Reports multiple episodes of loose bowel movements over the past 3 days.  No BRBPR or melena.  Past Medical History Past Medical History:  Diagnosis Date   Asthma    There are no active problems to display for this patient.  Home Medication(s) Prior to Admission medications   Medication Sig Start Date End Date Taking? Authorizing Provider  nitrofurantoin, macrocrystal-monohydrate, (MACROBID) 100 MG capsule Take 100 mg by mouth 2 (two) times daily. 08/17/23  Yes [provider]  sucralfate (CARAFATE) 1 g tablet Take 1 tablet (1 g total) by mouth with breakfast, with lunch, and with evening meal for 7 days. 08/25/23 09/01/23 Yes Sloan Leiter, DO  benzonatate (TESSALON) 100 MG capsule Take 1 capsule (100 mg total) by mouth every 8 (eight) hours. 06/23/22   Karie Mainland, Amjad, PA-C  Loratadine (CLARITIN PO) Take by mouth.    [provider]  loratadine (CLARITIN) 10 MG tablet Take 10 mg by mouth daily. 02/25/22   [provider]  meloxicam (MOBIC) 7.5 MG tablet Take 1 tablet (7.5 mg total) by mouth daily. 07/12/18   Jeannie Fend, PA-C  mometasone (NASONEX) 50 MCG/ACT nasal spray Place 2 sprays into the nose daily.    [provider]  montelukast (SINGULAIR) 10 MG tablet SMARTSIG:1 Tablet(s) By Mouth Every Evening 02/25/22   [provider]  Montelukast Sodium (SINGULAIR PO) Take by mouth.    [provider]  orphenadrine (NORFLEX) 100 MG tablet Take 1 tablet (100 mg total) by mouth 2 (two) times daily. 07/12/18   Jeannie Fend, PA-C  oseltamivir (TAMIFLU) 75 MG capsule Take 1 capsule (75 mg total) by mouth every 12 (twelve) hours. 06/16/22   Pollyann Savoy, MD  predniSONE (DELTASONE) 10 MG tablet Take as directed on attached sheet. 06/16/22   Pollyann Savoy, MD  prochlorperazine (COMPAZINE) 10 MG tablet Take 1 tablet (10 mg total) by mouth 2 (two) times daily as needed for nausea or vomiting (or headache, may take with benadryl 25mg ). 02/15/22   Alvira Monday, MD  SYMBICORT 160-4.5 MCG/ACT inhaler Inhale 2 puffs into the lungs 2 (two) times daily. 02/19/22   [provider]  Dwyane Luo 200-62.5-25 MCG/ACT AEPB Take 1 puff by mouth daily.    [provider]  zonisamide (ZONEGRAN) 25 MG capsule Take 100 mg by mouth at bedtime. 03/05/22   [provider]  Past Surgical History History reviewed. No pertinent surgical history. Family History History reviewed. No pertinent family history.  Social History Social History   Tobacco Use   Smoking status: Never   Smokeless tobacco: Never  Substance Use Topics   Alcohol use: Never   Drug use: No   Allergies Other  Review of Systems A thorough review of systems was obtained and all systems are negative except as noted in the HPI and PMH.   Physical Exam Vital Signs  I have reviewed the triage vital signs BP 130/80 (BP Location: Left Arm)   Pulse 78   Temp 98.6 F (37 C) (Oral)   Resp 18   Ht 5\' 4"  (1.626 m)   Wt 77.1 kg   SpO2 99%   BMI 29.18 kg/m  Physical Exam Vitals and nursing note reviewed.  Constitutional:      General: She is not in acute distress.    Appearance: Normal appearance.   HENT:     Head: Normocephalic and atraumatic.     Right Ear: External ear normal.     Left Ear: External ear normal.     Nose: Nose normal.     Mouth/Throat:     Mouth: Mucous membranes are moist.  Eyes:     General: No scleral icterus.       Right eye: No discharge.        Left eye: No discharge.  Cardiovascular:     Rate and Rhythm: Normal rate and regular rhythm.     Pulses: Normal pulses.     Heart sounds: Normal heart sounds.  Pulmonary:     Effort: Pulmonary effort is normal. No respiratory distress.     Breath sounds: Normal breath sounds. No stridor.  Abdominal:     General: Abdomen is flat. There is no distension.     Palpations: Abdomen is soft.     Tenderness: There is generalized abdominal tenderness.  Musculoskeletal:     Cervical back: No rigidity.     Right lower leg: No edema.     Left lower leg: No edema.  Skin:    General: Skin is warm and dry.     Capillary Refill: Capillary refill takes less than 2 seconds.  Neurological:     Mental Status: She is alert.  Psychiatric:        Mood and Affect: Mood normal.        Behavior: Behavior normal. Behavior is cooperative.     ED Results and Treatments Labs (all labs ordered are listed, but only abnormal results are displayed) Labs Reviewed  LIPASE, BLOOD - Abnormal; Notable for the following components:      Result Value   Lipase 10 (*)    All other components within normal limits  COMPREHENSIVE METABOLIC PANEL - Abnormal; Notable for the following components:   BUN 5 (*)    Calcium 8.7 (*)    All other components within normal limits  CBC - Abnormal; Notable for the following components:   WBC 3.7 (*)    RBC 5.15 (*)    MCV 79.6 (*)    MCH 25.2 (*)    All other components within normal limits  RESP PANEL BY RT-PCR (RSV, FLU A&B, COVID)  RVPGX2  GASTROINTESTINAL PANEL BY PCR, STOOL (REPLACES STOOL CULTURE)  URINALYSIS, ROUTINE W REFLEX MICROSCOPIC  PREGNANCY, URINE  Radiology CT ABDOMEN PELVIS W CONTRAST Result Date: 08/25/2023 CLINICAL DATA:  Abdominal pain, acute, nonlocalized.  Diarrhea. EXAM: CT ABDOMEN AND PELVIS WITH CONTRAST TECHNIQUE: Multidetector CT imaging of the abdomen and pelvis was performed using the standard protocol following bolus administration of intravenous contrast. RADIATION DOSE REDUCTION: This exam was performed according to the departmental dose-optimization program which includes automated exposure control, adjustment of the mA and/or kV according to patient size and/or use of iterative reconstruction technique. CONTRAST:  OMNIPAQUE IOHEXOL 300 MG/ML  SOLN COMPARISON:  CT abdomen/pelvis dated 02/15/2022. FINDINGS: Lower chest: No acute abnormality. Hepatobiliary: Stable subcentimeter focal hypodensities within the right and left hepatic lobes including an unchanged 5 mm focal hypodensity in the left hepatic lobe are too small to definitively characterize. Gallbladder is unremarkable. No biliary dilatation. Pancreas: Unremarkable. No pancreatic ductal dilatation or surrounding inflammatory changes. Spleen: Normal in size without focal abnormality. Adrenals/Urinary Tract: Adrenal glands are unremarkable. Kidneys are normal, without renal calculi, suspicious focal lesion, or hydronephrosis. Bladder is unremarkable. Stomach/Bowel: Stomach is within normal limits. Appendix appears normal. No evidence of bowel wall thickening, distention, or inflammatory changes. Vascular/Lymphatic: The abdominal aorta is normal in caliber. No enlarged abdominal or pelvic lymph nodes. Reproductive: IUD in place. There is a 2.9 cm simple appearing left adnexal cyst, for which no specific follow-up imaging is recommended. Other: No abdominopelvic ascites. No intraperitoneal free air. No abdominal wall hernia. Musculoskeletal: No acute osseous abnormality. No suspicious osseous  lesion. IMPRESSION: No acute localizing findings in the abdomen or pelvis. Electronically Signed   By: Hart Robinsons M.D.   On: 08/25/2023 13:59    Pertinent labs & imaging results that were available during my care of the patient were reviewed by me and considered in my medical decision making (see MDM for details).  Medications Ordered in ED Medications  iohexol (OMNIPAQUE) 300 MG/ML solution 100 mL (100 mLs Intravenous Contrast Given 08/25/23 1152)                                                                                                                                     Procedures Procedures  (including critical care time)  Medical Decision Making / ED Course    Medical Decision Making:    Laurine Kuyper is a 53 y.o. female with past medical history as below, significant for asthma who presents to the ED with complaint of abdominal pain, diarrhea. The complaint involves an extensive differential diagnosis and also carries with it a high risk of complications and morbidity.  Serious etiology was considered. Ddx includes but is not limited to: Differential diagnosis includes but is not exclusive to acute cholecystitis, intrathoracic causes for epigastric abdominal pain, gastritis, duodenitis, pancreatitis, small bowel or large bowel obstruction, abdominal aortic aneurysm, hernia, gastritis, etc.   Complete initial physical exam performed, notably the patient was in no acute distress, abdomen is soft, nonperitoneal.    Reviewed and confirmed nursing documentation  for past medical history, family history, social history.  Vital signs reviewed.     Clinical Course as of 08/26/23 0758  Wed Aug 25, 2023  1409 CTAP stable  Has not had to have bowel movement since arrival  Feeling much better overall, will trial p.o. challenge [SG]    Clinical Course User Index [SG] Sloan Leiter, DO    Brief summary: 53 year old female here with abdominal pain, diarrhea, headaches.   She recently took Macrobid around a week ago.  No history of C. difficile.  No recent hospitalization.  No recent travel, no sick contacts or suspicious p.o. intake. Will check C. difficile and GI PCR panel.  Check screening labs, give fluids, check CT of the abdomen   Labs reviewed, these are stable, she is feeling much better on recheck, no further episodes of diarrhea.  Unlikely be C. difficile given improvement to her diarrhea.  Stable WBC and stable imaging.  Urinalysis without infection, okay to discontinue Macrobid  Encouraged brat diet, rehydration, follow-up PCP to provide stool sample if diarrhea persist.  She is able to tolerate p.o. intake in the emergency department.  No vomiting.  No further diarrhea.  Abdominal pain warnings discussed with patient  I have suspicion for enteritis or foodborne illness.  She is having abdominal cramping, diarrhea.  Nonbloody nonmelanotic.  The patient improved significantly and was discharged in stable condition. Detailed discussions were had with the patient/guardian regarding current findings, and need for close f/u with PCP or on call doctor. The patient/guardian has been instructed to return immediately if the symptoms worsen in any way for re-evaluation. Patient/guardian verbalized understanding and is in agreement with current care plan. All questions answered prior to discharge.            Additional history obtained: -Additional history obtained from na -External records from outside source obtained and reviewed including: Chart review including previous notes, labs, imaging, consultation notes including  Prior labs and imaging, primary care documentation, home medications   Lab Tests: -I ordered, reviewed, and interpreted labs.   The pertinent results include:   Labs Reviewed  LIPASE, BLOOD - Abnormal; Notable for the following components:      Result Value   Lipase 10 (*)    All other components within normal limits   COMPREHENSIVE METABOLIC PANEL - Abnormal; Notable for the following components:   BUN 5 (*)    Calcium 8.7 (*)    All other components within normal limits  CBC - Abnormal; Notable for the following components:   WBC 3.7 (*)    RBC 5.15 (*)    MCV 79.6 (*)    MCH 25.2 (*)    All other components within normal limits  RESP PANEL BY RT-PCR (RSV, FLU A&B, COVID)  RVPGX2  GASTROINTESTINAL PANEL BY PCR, STOOL (REPLACES STOOL CULTURE)  URINALYSIS, ROUTINE W REFLEX MICROSCOPIC  PREGNANCY, URINE    Notable for labs stable  EKG   EKG Interpretation Date/Time:    Ventricular Rate:    PR Interval:    QRS Duration:    QT Interval:    QTC Calculation:   R Axis:      Text Interpretation:           Imaging Studies ordered: I ordered imaging studies including CTAP I independently visualized the following imaging with scope of interpretation limited to determining acute life threatening conditions related to emergency care; findings noted above I independently visualized and interpreted imaging. I agree with the radiologist interpretation  Medicines ordered and prescription drug management: Meds ordered this encounter  Medications   iohexol (OMNIPAQUE) 300 MG/ML solution 100 mL   sucralfate (CARAFATE) 1 g tablet    Sig: Take 1 tablet (1 g total) by mouth with breakfast, with lunch, and with evening meal for 7 days.    Dispense:  21 tablet    Refill:  0    -I have reviewed the patients home medicines and have made adjustments as needed   Consultations Obtained: na   Cardiac Monitoring: The patient was maintained on a cardiac monitor.  I personally viewed and interpreted the cardiac monitored which showed an underlying rhythm of: NSR Continuous pulse oximetry interpreted by myself, 100% on RA.    Social Determinants of Health:  Diagnosis or treatment significantly limited by social determinants of health: no pcp   Reevaluation: After the interventions noted above, I  reevaluated the patient and found that they have improved  Co morbidities that complicate the patient evaluation  Past Medical History:  Diagnosis Date   Asthma       Dispostion: Disposition decision including need for hospitalization was considered, and patient discharged from emergency department.    Final Clinical Impression(s) / ED Diagnoses Final diagnoses:  Diarrhea, unspecified type        Sloan Leiter, DO 08/26/23 [phone]

## 2023-08-25 NOTE — ED Triage Notes (Signed)
C/o diarrhea x 3 days. States unable go more than 15 mins without going to bathroom. ABD cramping. Also endorses headache.

## 2023-08-25 NOTE — ED Notes (Signed)
Pt aware of the need for a urine and stool sample... Unable to currently provide the sample.Marland KitchenMarland Kitchen

## 2023-08-25 NOTE — Discharge Instructions (Addendum)
Recommend following BRAT diet that consists of bananas rice applesauce toast to reduce abdominal discomfort and to reduce diarrhea.  Try to stay hydrated, drink sips of liquid throughout the day.  Eat small bites of food throughout the day.  If your diarrhea continues for 3 more days please follow-up with your PCP for stool sample.  When you wash your hands thoroughly after using the restroom.   It was a pleasure caring for you today in the emergency department.  Please return to the emergency department for any worsening or worrisome symptoms.

## 2023-09-06 ENCOUNTER — Other Ambulatory Visit (HOSPITAL_BASED_OUTPATIENT_CLINIC_OR_DEPARTMENT_OTHER): Payer: Self-pay

## 2023-09-07 DIAGNOSIS — G43719 Chronic migraine without aura, intractable, without status migrainosus: Secondary | ICD-10-CM | POA: Diagnosis not present

## 2023-09-07 DIAGNOSIS — M542 Cervicalgia: Secondary | ICD-10-CM | POA: Diagnosis not present

## 2023-09-07 DIAGNOSIS — G518 Other disorders of facial nerve: Secondary | ICD-10-CM | POA: Diagnosis not present

## 2023-09-07 DIAGNOSIS — M791 Myalgia, unspecified site: Secondary | ICD-10-CM | POA: Diagnosis not present

## 2023-10-13 ENCOUNTER — Emergency Department (HOSPITAL_BASED_OUTPATIENT_CLINIC_OR_DEPARTMENT_OTHER)
Admission: EM | Admit: 2023-10-13 | Discharge: 2023-10-13 | Disposition: A | Discharge status: Home / Self Care | Attending: Emergency Medicine | Admitting: Emergency Medicine

## 2023-10-13 ENCOUNTER — Encounter (HOSPITAL_BASED_OUTPATIENT_CLINIC_OR_DEPARTMENT_OTHER): Payer: Self-pay

## 2023-10-13 ENCOUNTER — Other Ambulatory Visit: Payer: Self-pay

## 2023-10-13 DIAGNOSIS — T7840XA Allergy, unspecified, initial encounter: Secondary | ICD-10-CM | POA: Diagnosis not present

## 2023-10-13 DIAGNOSIS — J45909 Unspecified asthma, uncomplicated: Secondary | ICD-10-CM | POA: Diagnosis not present

## 2023-10-13 DIAGNOSIS — Z7951 Long term (current) use of inhaled steroids: Secondary | ICD-10-CM | POA: Insufficient documentation

## 2023-10-13 DIAGNOSIS — R519 Headache, unspecified: Secondary | ICD-10-CM | POA: Diagnosis not present

## 2023-10-13 DIAGNOSIS — T781XXA Other adverse food reactions, not elsewhere classified, initial encounter: Secondary | ICD-10-CM | POA: Diagnosis not present

## 2023-10-13 MED ORDER — DIPHENHYDRAMINE HCL 25 MG PO CAPS
25.0000 mg | ORAL_CAPSULE | Freq: Once | ORAL | Status: AC
  Administered 2023-10-13: 25 mg via ORAL
  Filled 2023-10-13: qty 1

## 2023-10-13 MED ORDER — FAMOTIDINE IN NACL 20-0.9 MG/50ML-% IV SOLN
20.0000 mg | Freq: Once | INTRAVENOUS | Status: AC
  Administered 2023-10-13: 20 mg via INTRAVENOUS
  Filled 2023-10-13: qty 50

## 2023-10-13 MED ORDER — DEXAMETHASONE SODIUM PHOSPHATE 10 MG/ML IJ SOLN
10.0000 mg | Freq: Once | INTRAMUSCULAR | Status: AC
  Administered 2023-10-13: 10 mg via INTRAVENOUS
  Filled 2023-10-13: qty 1

## 2023-10-13 MED ORDER — EPINEPHRINE 0.3 MG/0.3ML IJ SOAJ: 0.3000 mg | INTRAMUSCULAR | 0 refills | Status: AC | PRN

## 2023-10-13 MED ORDER — ACETAMINOPHEN 500 MG PO TABS
1000.0000 mg | ORAL_TABLET | Freq: Once | ORAL | Status: AC
  Administered 2023-10-13: 1000 mg via ORAL
  Filled 2023-10-13: qty 2

## 2023-10-13 NOTE — ED Provider Notes (Signed)
 [place] EMERGENCY DEPARTMENT AT [facility] Provider Note   CSN: [id number] Arrival date & time: 10/13/23  1712     History  Chief Complaint  Patient presents with   Allergic Reaction    Tamara Bowers is a 52 y.o. female with medical history of asthma.  The patient presents to the ED for evaluation of possible allergic reaction.  States that around 3 PM she was eating cake.  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  She reports that she began eating the cake, then noticed that she had a "lump" in her throat.  She reports that her throat became itchy at this time and she was continuously having to clear her throat.  She states she also developed a right-sided headache around this time, she reports that she only ever gets headaches when she eats pineapple.  Reports that she advised her coworker of her symptoms.  Her coworker provided her with epinephrine pen which did alleviate the majority of her symptoms.  She reports after EpiPen was administered she had no feelings of being unable to swallow.  She denies any nausea, vomiting, abdominal pain, hives.  States that after epinephrine, her symptoms have seemed to resolve and improve.  She is still complaining of a headache.  Denies any other concerns.   Allergic Reaction Presenting symptoms: difficulty swallowing   Presenting symptoms: no rash        Home Medications Prior to Admission medications   Medication Sig Start Date End Date Taking? Authorizing Provider  EPINEPHrine 0.3 mg/0.3 mL IJ SOAJ injection Inject 0.3 mg into the muscle as needed for anaphylaxis. 10/13/23  Yes Al Decant, PA-C  benzonatate (TESSALON) 100 MG capsule Take 1 capsule (100 mg total) by mouth every 8 (eight) hours. 06/23/22   Karie Mainland, Amjad, PA-C  Loratadine (CLARITIN PO) Take by mouth.    [provider]  loratadine (CLARITIN) 10 MG tablet Take 10 mg by mouth  daily. 02/25/22   [provider]  meloxicam (MOBIC) 7.5 MG tablet Take 1 tablet (7.5 mg total) by mouth daily. 07/12/18   Jeannie Fend, PA-C  mometasone (NASONEX) 50 MCG/ACT nasal spray Place 2 sprays into the nose daily.    [provider]  montelukast (SINGULAIR) 10 MG tablet SMARTSIG:1 Tablet(s) By Mouth Every Evening 02/25/22   [provider]  Montelukast Sodium (SINGULAIR PO) Take by mouth.    [provider]  nitrofurantoin, macrocrystal-monohydrate, (MACROBID) 100 MG capsule Take 100 mg by mouth 2 (two) times daily. 08/17/23   [provider]  orphenadrine (NORFLEX) 100 MG tablet Take 1 tablet (100 mg total) by mouth 2 (two) times daily. 07/12/18   Jeannie Fend, PA-C  oseltamivir (TAMIFLU) 75 MG capsule Take 1 capsule (75 mg total) by mouth every 12 (twelve) hours. 06/16/22   Pollyann Savoy, MD  predniSONE (DELTASONE) 10 MG tablet Take as directed on attached sheet. 06/16/22   Pollyann Savoy, MD  prochlorperazine (COMPAZINE) 10 MG tablet Take 1 tablet (10 mg total) by mouth 2 (two) times daily as needed for nausea or vomiting (or headache, may take with benadryl 25mg ). 02/15/22   Alvira Monday, MD  sucralfate (CARAFATE) 1 g tablet Take 1 tablet (1 g total) by mouth with breakfast, with lunch, and with evening meal for 7 days. 08/25/23 09/01/23  Sloan Leiter, DO  SYMBICORT 160-4.5 MCG/ACT inhaler Inhale 2 puffs into the  lungs 2 (two) times daily. 02/19/22   [provider]  Dwyane Luo 200-62.5-25 MCG/ACT AEPB Take 1 puff by mouth daily.    [provider]  zonisamide (ZONEGRAN) 25 MG capsule Take 100 mg by mouth at bedtime. 03/05/22   [provider]      Allergies    Other    Review of Systems   Review of Systems  HENT:  Positive for sore throat and trouble swallowing.   Skin:  Negative for rash.  Neurological:  Positive for headaches.    Physical Exam Updated Vital Signs BP 130/75   Pulse 79    Temp 97.8 F (36.6 C) (Oral)   Resp 18   Wt 83 kg   SpO2 100%   BMI 31.41 kg/m  Physical Exam Vitals and nursing note reviewed.  Constitutional:      General: She is not in acute distress.    Appearance: She is well-developed.  HENT:     Head: Normocephalic and atraumatic.     Mouth/Throat:     Comments: Posterior oropharynx without erythema, no exudate.  Uvula midline.  Handling secretions appropriately.  No drooling. Eyes:     Conjunctiva/sclera: Conjunctivae normal.  Cardiovascular:     Rate and Rhythm: Normal rate and regular rhythm.     Heart sounds: No murmur heard. Pulmonary:     Effort: Pulmonary effort is normal. No respiratory distress.     Breath sounds: Normal breath sounds.  Abdominal:     Palpations: Abdomen is soft.     Tenderness: There is no abdominal tenderness.  Musculoskeletal:        General: No swelling.     Cervical back: Neck supple.  Skin:    General: Skin is warm and dry.     Capillary Refill: Capillary refill takes less than 2 seconds.     Comments: No evidence of urticaria  Neurological:     Mental Status: She is alert.     Comments: CN III through XII intact.  Intact finger-nose and heel-to-shin.  No pronator drift, no slurred speech.  Equal grip strength upper extremities bilaterally.  Equal strength bilateral lower extremities.  Psychiatric:        Mood and Affect: Mood normal.     ED Results / Procedures / Treatments   Labs (all labs ordered are listed, but only abnormal results are displayed) Labs Reviewed - No data to display  EKG None  Radiology No results found.  Procedures Procedures   Medications Ordered in ED Medications  famotidine (PEPCID) IVPB 20 mg premix (0 mg Intravenous Stopped 10/13/23 1830)  dexamethasone (DECADRON) injection 10 mg (10 mg Intravenous Given 10/13/23 1746)  diphenhydrAMINE (BENADRYL) capsule 25 mg (25 mg Oral Given 10/13/23 1746)  acetaminophen (TYLENOL) tablet 1,000 mg (1,000 mg Oral Given 10/13/23  1746)    ED Course/ Medical Decision Making/ A&P Clinical Course as of 10/13/23 1844  Wed Oct 13, 2023  1827 Observe until 8:15PM [CG]    Clinical Course User Index [CG] Al Decant, PA-C   Medical Decision Making Risk OTC drugs. Prescription drug management.   53 year old female presents for allergic reaction.  Please see HPI for further details.  On examination patient is afebrile and nontachycardic.  Her lung sounds are clear bilaterally, she is not hypoxic.  Her abdomen is soft and compressible.  Her neurological examinations at baseline.  Her posterior oropharynx has no erythema, no exudate.  Uvula midline.  She is handling secretions appropriately.  She has  no drooling, there is no change in phonation.  She is overall nontoxic in appearance.  She has reassuring vital signs.  Patient reports administering epinephrine to herself at 4:15 PM.  She reports that she had resolution of symptoms without point.  She is still complaining of a slight itchy throat, globus sensation in her throat.  Will provide with 10 mg Decadron, 20 mg famotidine, 25 mg Benadryl.  Will observe patient until 8:15 PM.  Update: Patient reassessed after medications administered, reports that she has had resolution of symptoms at this time.  Will observe until 8:15 PM.  Will sign patient out to [employer] pending observation and reassessment.  Plan of management discussed.   Final Clinical Impression(s) / ED Diagnoses Final diagnoses:  Allergic reaction, initial encounter    Rx / DC Orders ED Discharge Orders          Ordered    EPINEPHrine 0.3 mg/0.3 mL IJ SOAJ injection  As needed        10/13/23 1832              Al Decant, PA-C 10/13/23 1847    Melene Plan, DO 10/13/23 1931

## 2023-10-13 NOTE — ED Provider Notes (Signed)
 Signout from [employer] at shift change. Briefly, patient presents for allergic reaction, lump in throat.    Plan: Reassess at around 815 for improvement.   8:11 PM Reassessment performed. Patient appears stable, comfortable.  She has a headache from the steroid.  No breathing difficulties.  On exam, posterior oropharynx is clear, lungs are clear.  Most current vital signs reviewed and are as follows: BP 138/84   Pulse 71   Temp 97.8 F (36.6 C) (Oral)   Resp (!) 22   Wt 83 kg   SpO2 98%   BMI 31.41 kg/m     Plan: Discharge to home   Home treatment: Monitor closely for recurrent symptoms, antihistamines, EpiPen sent in   Return and follow-up instructions: Encouraged return to ED with recurrent symptoms.  Follow-up with PCP as needed.    Renne Crigler, PA-C 10/13/23 2013    Melene Plan, DO 10/13/23 2105

## 2023-10-13 NOTE — Discharge Instructions (Addendum)
 It was a pleasure taking part in your care.  As we discussed, after 4 hours of observation your symptoms seem to of resolved.  Please follow-up with your PCP for further management and care.  If you have a return of throat closure or swelling please return to the ED for further management and care.  I have sent an EpiPen into your pharmacy, please pick this up at your earliest convenience.

## 2023-10-13 NOTE — ED Triage Notes (Addendum)
 Pt reports eating a piece of cake with pineapple. 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 Unaware pineapple in cake. Complains of itchy throat, dyr cough tongue feels itchy/swollen on side  on sides. Used epi pen, but pen was expired 2 years. Some improvement with symptoms. Has HA and feels jittery due to epi. Took epi pen approx 45 mins ago. Normally only has only HA when consumes pineapple No hives or rash noted. No swelling of throat

## 2023-10-15 ENCOUNTER — Other Ambulatory Visit: Payer: Self-pay

## 2023-10-15 ENCOUNTER — Encounter (HOSPITAL_BASED_OUTPATIENT_CLINIC_OR_DEPARTMENT_OTHER): Payer: Self-pay | Admitting: Urology

## 2023-10-15 ENCOUNTER — Emergency Department (HOSPITAL_BASED_OUTPATIENT_CLINIC_OR_DEPARTMENT_OTHER)

## 2023-10-15 ENCOUNTER — Emergency Department (HOSPITAL_BASED_OUTPATIENT_CLINIC_OR_DEPARTMENT_OTHER)
Admission: EM | Admit: 2023-10-15 | Discharge: 2023-10-15 | Disposition: A | Discharge status: Home / Self Care | Attending: Emergency Medicine | Admitting: Emergency Medicine

## 2023-10-15 DIAGNOSIS — L299 Pruritus, unspecified: Secondary | ICD-10-CM | POA: Diagnosis not present

## 2023-10-15 DIAGNOSIS — R0602 Shortness of breath: Secondary | ICD-10-CM | POA: Diagnosis not present

## 2023-10-15 DIAGNOSIS — Z79899 Other long term (current) drug therapy: Secondary | ICD-10-CM | POA: Diagnosis not present

## 2023-10-15 DIAGNOSIS — Z7951 Long term (current) use of inhaled steroids: Secondary | ICD-10-CM | POA: Insufficient documentation

## 2023-10-15 DIAGNOSIS — R059 Cough, unspecified: Secondary | ICD-10-CM | POA: Diagnosis not present

## 2023-10-15 DIAGNOSIS — J4520 Mild intermittent asthma, uncomplicated: Secondary | ICD-10-CM | POA: Diagnosis not present

## 2023-10-15 LAB — CBC WITH DIFFERENTIAL/PLATELET
Abs Immature Granulocytes: 0.1 10*3/uL — ABNORMAL HIGH (ref 0.00–0.07)
Basophils Absolute: 0.1 10*3/uL (ref 0.0–0.1)
Basophils Relative: 1 %
Eosinophils Absolute: 0.1 10*3/uL (ref 0.0–0.5)
Eosinophils Relative: 1 %
HCT: 37.4 % (ref 36.0–46.0)
Hemoglobin: 12.2 g/dL (ref 12.0–15.0)
Immature Granulocytes: 1 %
Lymphocytes Relative: 39 %
Lymphs Abs: 4.8 10*3/uL — ABNORMAL HIGH (ref 0.7–4.0)
MCH: 26.1 pg (ref 26.0–34.0)
MCHC: 32.6 g/dL (ref 30.0–36.0)
MCV: 79.9 fL — ABNORMAL LOW (ref 80.0–100.0)
Monocytes Absolute: 0.6 10*3/uL (ref 0.1–1.0)
Monocytes Relative: 5 %
Neutro Abs: 6.6 10*3/uL (ref 1.7–7.7)
Neutrophils Relative %: 53 %
Platelets: 298 10*3/uL (ref 150–400)
RBC: 4.68 MIL/uL (ref 3.87–5.11)
RDW: 14.9 % (ref 11.5–15.5)
Smear Review: NORMAL
WBC: 12.2 10*3/uL — ABNORMAL HIGH (ref 4.0–10.5)
nRBC: 0 % (ref 0.0–0.2)

## 2023-10-15 LAB — BASIC METABOLIC PANEL WITH GFR
Anion gap: 10 (ref 5–15)
BUN: 13 mg/dL (ref 6–20)
CO2: 25 mmol/L (ref 22–32)
Calcium: 8.8 mg/dL — ABNORMAL LOW (ref 8.9–10.3)
Chloride: 100 mmol/L (ref 98–111)
Creatinine, Ser: 1.05 mg/dL — ABNORMAL HIGH (ref 0.44–1.00)
GFR, Estimated: >60 mL/min (ref >60)
Glucose, Bld: 82 mg/dL (ref 70–99)
Potassium: 3.4 mmol/L — ABNORMAL LOW (ref 3.5–5.1)
Sodium: 135 mmol/L (ref 135–145)

## 2023-10-15 MED ORDER — ALBUTEROL SULFATE HFA 108 (90 BASE) MCG/ACT IN AERS
1.0000 | INHALATION_SPRAY | Freq: Four times a day (QID) | RESPIRATORY_TRACT | 0 refills | Status: AC | PRN
Start: 2023-10-15 — End: ?

## 2023-10-15 MED ORDER — PREDNISONE 10 MG PO TABS: ORAL_TABLET | ORAL | 0 refills | Status: DC

## 2023-10-15 MED ORDER — IPRATROPIUM BROMIDE 0.02 % IN SOLN
0.5000 mg | Freq: Once | RESPIRATORY_TRACT | Status: AC
  Administered 2023-10-15: 0.5 mg via RESPIRATORY_TRACT
  Filled 2023-10-15: qty 2.5

## 2023-10-15 MED ORDER — SODIUM CHLORIDE 0.9 % IV BOLUS
1000.0000 mL | Freq: Once | INTRAVENOUS | Status: AC
  Administered 2023-10-15: 1000 mL via INTRAVENOUS

## 2023-10-15 MED ORDER — ALBUTEROL SULFATE (2.5 MG/3ML) 0.083% IN NEBU
5.0000 mg | INHALATION_SOLUTION | Freq: Once | RESPIRATORY_TRACT | Status: AC
  Administered 2023-10-15: 5 mg via RESPIRATORY_TRACT
  Filled 2023-10-15: qty 6

## 2023-10-15 NOTE — ED Triage Notes (Signed)
 Pt states having same concerns as last visit on 4/2, states scratchy throat and clearing throat that started while at work  Clearing throat at this time  NAD noted  States slight SOB

## 2023-10-15 NOTE — Discharge Instructions (Signed)
 Follow up with your doctor if symptoms persist. Return to the ED with any new or worsening symptoms.   If you feel your inhalers are less effective over the next 2 days, fill the prednisone prescription and take as directed. If your symptoms become significant/severe, it is important to return to use your EpiPen and return to the emergency department immediately for further management.

## 2023-10-15 NOTE — ED Provider Notes (Signed)
 [facility] Provider Note   CSN: [id number] Arrival date & time: 10/15/23  1424     History  Chief Complaint  Patient presents with   Allergic Reaction    Tamara Bowers is a 53 y.o. female.  Patient to ED with symptoms that started today of cough, SOB, worse with activity and lying down. Seen 2 days ago for allergic reaction after eating pineapple mistakenly. She reports she has a dry, itchy throat, similar to her reaction, but also SOB and cough that reminds her of her asthma falres. She has not used her inhaler today. She received a steroid on 4/2 but no further steroids at home. She is taking allergy medications only.   The history is provided by the patient. No language interpreter was used.  Allergic Reaction      Home Medications Prior to Admission medications   Medication Sig Start Date End Date Taking? Authorizing Provider  albuterol (VENTOLIN HFA) 108 (90 Base) MCG/ACT inhaler Inhale 1-2 puffs into the lungs every 6 (six) hours as needed for wheezing or shortness of breath. 10/15/23  Yes Mell Mellott, PA-C  benzonatate (TESSALON) 100 MG capsule Take 1 capsule (100 mg total) by mouth every 8 (eight) hours. 06/23/22   Marita Kansas, PA-C  EPINEPHrine 0.3 mg/0.3 mL IJ SOAJ injection Inject 0.3 mg into the muscle as needed for anaphylaxis. 10/13/23   Al Decant, PA-C  Loratadine (CLARITIN PO) Take by mouth.    [provider]  loratadine (CLARITIN) 10 MG tablet Take 10 mg by mouth daily. 02/25/22   [provider]  meloxicam (MOBIC) 7.5 MG tablet Take 1 tablet (7.5 mg total) by mouth daily. 07/12/18   Jeannie Fend, PA-C  mometasone (NASONEX) 50 MCG/ACT nasal spray Place 2 sprays into the nose daily.    [provider]  montelukast (SINGULAIR) 10 MG tablet SMARTSIG:1 Tablet(s) By Mouth Every Evening 02/25/22   [provider]  Montelukast Sodium (SINGULAIR PO) Take by mouth.    [provider]  nitrofurantoin, macrocrystal-monohydrate, (MACROBID) 100 MG capsule Take 100 mg by mouth 2 (two) times daily. 08/17/23   [provider]  orphenadrine (NORFLEX) 100 MG tablet Take 1 tablet (100 mg total) by mouth 2 (two) times daily. 07/12/18   Jeannie Fend, PA-C  oseltamivir (TAMIFLU) 75 MG capsule Take 1 capsule (75 mg total) by mouth every 12 (twelve) hours. 06/16/22   Pollyann Savoy, MD  predniSONE (DELTASONE) 10 MG tablet Take 6 on day 1 Take 5 on day 2 Take 4 on day 3 Take 3 on day 4 Take 2 on day 5 Take 1 on day 6 10/15/23   Elpidio Anis, PA-C  prochlorperazine (COMPAZINE) 10 MG tablet Take 1 tablet (10 mg total) by mouth 2 (two) times daily as needed for nausea or vomiting (or headache, may take with benadryl 25mg ). 02/15/22   Alvira Monday, MD  sucralfate (CARAFATE) 1 g tablet Take 1 tablet (1 g total) by mouth with breakfast, with lunch, and with evening meal for 7 days. 08/25/23 09/01/23  Sloan Leiter, DO  SYMBICORT 160-4.5 MCG/ACT inhaler Inhale 2 puffs into the lungs 2 (two) times daily. 02/19/22   [provider]  Dwyane Luo 200-62.5-25 MCG/ACT AEPB Take 1 puff by mouth daily.    [provider]  zonisamide (ZONEGRAN) 25 MG capsule Take 100 mg by mouth at bedtime. 03/05/22   [provider]      Allergies  Other    Review of Systems   Review of Systems  Physical Exam Updated Vital Signs BP 123/73   Pulse 74   Temp 97.9 F (36.6 C)   Resp 16   Ht 5\' 4"  (1.626 m)   Wt 83 kg   SpO2 100%   BMI 31.41 kg/m  Physical Exam Constitutional:      Appearance: She is well-developed.  HENT:     Head: Normocephalic.     Mouth/Throat:     Mouth: Mucous membranes are moist.     Pharynx: No oropharyngeal exudate or posterior oropharyngeal erythema.  Cardiovascular:     Rate and Rhythm: Normal rate and regular rhythm.     Heart sounds: No murmur heard. Pulmonary:     Effort: Pulmonary effort is normal.     Breath  sounds: Normal breath sounds. No wheezing, rhonchi or rales.     Comments: Active, persistent, dry cough during exam.  Abdominal:     Palpations: Abdomen is soft.     Tenderness: There is no abdominal tenderness. There is no guarding or rebound.  Musculoskeletal:        General: No swelling or tenderness. Normal range of motion.     Cervical back: Normal range of motion and neck supple.  Skin:    General: Skin is warm and dry.     Findings: No rash.  Neurological:     General: No focal deficit present.     Mental Status: She is alert and oriented to person, place, and time.     ED Results / Procedures / Treatments   Labs (all labs ordered are listed, but only abnormal results are displayed) Labs Reviewed  CBC WITH DIFFERENTIAL/PLATELET - Abnormal; Notable for the following components:      Result Value   WBC 12.2 (*)    MCV 79.9 (*)    Lymphs Abs 4.8 (*)    Abs Immature Granulocytes 0.10 (*)    All other components within normal limits  BASIC METABOLIC PANEL WITH GFR - Abnormal; Notable for the following components:   Potassium 3.4 (*)    Creatinine, Ser 1.05 (*)    Calcium 8.8 (*)    All other components within normal limits    EKG None  Radiology DG Chest Portable 1 View Result Date: 10/15/2023 CLINICAL DATA:  Shortness of breath.  Cough. EXAM: PORTABLE CHEST 1 VIEW COMPARISON:  06/16/2022. FINDINGS: Bilateral lung fields are clear. Bilateral costophrenic angles are clear. Normal cardio-mediastinal silhouette. No acute osseous abnormalities. The soft tissues are within normal limits. IMPRESSION: No active disease. Electronically Signed   By: Jules Schick M.D.   On: 10/15/2023 16:30    Procedures Procedures    Medications Ordered in ED Medications  albuterol (PROVENTIL) (2.5 MG/3ML) 0.083% nebulizer solution 5 mg (5 mg Nebulization Given 10/15/23 1534)  ipratropium (ATROVENT) nebulizer solution 0.5 mg (0.5 mg Nebulization Given 10/15/23 1534)  sodium chloride 0.9 %  bolus 1,000 mL (0 mLs Intravenous Stopped 10/15/23 1618)    ED Course/ Medical Decision Making/ A&P Clinical Course as of 10/15/23 1722  Fri Oct 15, 2023  1545 Patient to ED with itchy throat, SOB that started today. She had a similar presentation 2 days ago in the emergency department after eating pineapple which has caused allergic reaction in the past. She was treated with decadron, pepcid and benadryl and improved enough to warrant discharge. She was fine until today when she was working outside and felt a scratch in her throat  and SOB that was similar to asthma. She did not use her inhaler. VSS, O2 100%. No wheezing. Duoneb provided with significant improvement in symptoms. IV fluids as she felt dry/dehydrated. On recheck she feels much better. DDx: persistent allergic reaction vs allergy/asthma (mild). There is significant pollen outside, this is felt more likely.  [SU]  1721 Discussed prednisone on a taper dose and patient would prefer not to take this. Discussed discharge with written prescription to fill if regular inhaler use does not control symptoms. Discussed strict return precautions that should prompt use of her EpiPen and return to ED. Patient comfortable with discharge plan.   [SU]    Clinical Course User Index [SU] Elpidio Anis, PA-C                                 Medical Decision Making Amount and/or Complexity of Data Reviewed Labs: ordered. Radiology: ordered.  Risk Prescription drug management.           Final Clinical Impression(s) / ED Diagnoses Final diagnoses:  Mild intermittent asthma without complication    Rx / DC Orders ED Discharge Orders          Ordered    predniSONE (DELTASONE) 10 MG tablet  Status:  Discontinued        10/15/23 1705    albuterol (VENTOLIN HFA) 108 (90 Base) MCG/ACT inhaler  Every 6 hours PRN        10/15/23 1705    predniSONE (DELTASONE) 10 MG tablet        10/15/23 1716              Elpidio Anis,  PA-C 10/15/23 1722    Curatolo, Adam, DO 10/15/23 2003

## 2023-10-20 ENCOUNTER — Encounter: Payer: Self-pay | Admitting: Family Medicine

## 2023-10-20 ENCOUNTER — Ambulatory Visit: Admitting: Family Medicine

## 2023-10-20 VITALS — BP 105/67 | HR 73 | Temp 98.1°F | Resp 18 | Ht 64.0 in | Wt 185.6 lb

## 2023-10-20 DIAGNOSIS — F418 Other specified anxiety disorders: Secondary | ICD-10-CM

## 2023-10-20 DIAGNOSIS — Z7689 Persons encountering health services in other specified circumstances: Secondary | ICD-10-CM
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
## 2023-10-20 NOTE — Patient Instructions (Signed)
 Amedeo Gory, FNP-BC, PMHNP-BC

## 2023-11-02 ENCOUNTER — Telehealth: Payer: Self-pay | Admitting: Family Medicine

## 2023-11-02 NOTE — Telephone Encounter (Signed)
 Copied from CRM [id number]. Topic: Referral - Status >> Nov 02, 2023  2:21 PM Carlatta H wrote: Reason for CRM: Patient called to request status of referral//Please call to advise//Patient referral request was done in office with Dr Courtland Ditch

## 2023-11-16 DIAGNOSIS — J4540 Moderate persistent asthma, uncomplicated: Secondary | ICD-10-CM | POA: Diagnosis not present

## 2023-11-16 DIAGNOSIS — J3081 Allergic rhinitis due to animal (cat) (dog) hair and dander: Secondary | ICD-10-CM | POA: Diagnosis not present

## 2023-11-16 DIAGNOSIS — J3089 Other allergic rhinitis: Secondary | ICD-10-CM | POA: Diagnosis not present

## 2023-11-16 DIAGNOSIS — J301 Allergic rhinitis due to pollen: Secondary | ICD-10-CM | POA: Diagnosis not present

## 2023-11-17 DIAGNOSIS — G43719 Chronic migraine without aura, intractable, without status migrainosus: Secondary | ICD-10-CM | POA: Diagnosis not present

## 2023-11-17 DIAGNOSIS — G518 Other disorders of facial nerve: Secondary | ICD-10-CM | POA: Diagnosis not present

## 2023-11-17 DIAGNOSIS — M542 Cervicalgia: Secondary | ICD-10-CM | POA: Diagnosis not present

## 2023-11-17 DIAGNOSIS — M791 Myalgia, unspecified site: Secondary | ICD-10-CM | POA: Diagnosis not present

## 2023-12-13 DIAGNOSIS — J3081 Allergic rhinitis due to animal (cat) (dog) hair and dander: Secondary | ICD-10-CM | POA: Diagnosis not present

## 2023-12-13 DIAGNOSIS — J3089 Other allergic rhinitis: Secondary | ICD-10-CM | POA: Diagnosis not present

## 2023-12-13 DIAGNOSIS — T781XXA Other adverse food reactions, not elsewhere classified, initial encounter: Secondary | ICD-10-CM | POA: Diagnosis not present

## 2023-12-13 DIAGNOSIS — J4540 Moderate persistent asthma, uncomplicated: Secondary | ICD-10-CM | POA: Diagnosis not present

## 2023-12-13 DIAGNOSIS — J301 Allergic rhinitis due to pollen: Secondary | ICD-10-CM | POA: Diagnosis not present

## 2023-12-14 ENCOUNTER — Ambulatory Visit (HOSPITAL_COMMUNITY): Payer: Self-pay | Admitting: Family

## 2023-12-14 VITALS — BP 133/84 | HR 74 | Ht 64.0 in | Wt 191.0 lb

## 2023-12-14 DIAGNOSIS — F4323 Adjustment disorder with mixed anxiety and depressed mood: Secondary | ICD-10-CM | POA: Diagnosis not present

## 2023-12-14 MED ORDER — HYDROXYZINE HCL 10 MG PO TABS: 10.0000 mg | ORAL_TABLET | Freq: Three times a day (TID) | ORAL | 0 refills | Status: AC | PRN

## 2023-12-14 NOTE — Addendum Note (Signed)
 Addended by: Levester Reagin on: 12/14/2023 03:50 PM   Modules accepted: Level of Service
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
## 2023-12-15 ENCOUNTER — Telehealth (HOSPITAL_COMMUNITY): Payer: Self-pay | Admitting: Psychiatry

## 2023-12-15 NOTE — Telephone Encounter (Signed)
 D:  Placed call to [facility] ([phone]) to verify patient's MH Benefits per Pamula [place] request.  A:  Placed call, spoke to [place].  07-14-23 effective date; deductible @ $3,000 has been met, no copay, 20% co-insurance; no pre-cert needed for MH-IOP.  OOP $6,000; $4,438.53 has been accumulated.  Ref# for call [id number].  Inform pt and Dan Dun, NP.

## 2023-12-15 NOTE — Telephone Encounter (Signed)
 D:  Tanika referred pt to virtual MH-IOP.  A:  Placed call to provide pt with her insurance benefits from insurance company [employer]) and to orient pt, but there were no answer. Encouraged pt to call the case manager back.  Inform Dan Dun, NP.

## 2023-12-20 DIAGNOSIS — M1611 Unilateral primary osteoarthritis, right hip: Secondary | ICD-10-CM | POA: Diagnosis not present

## 2023-12-23 DIAGNOSIS — J301 Allergic rhinitis due to pollen: Secondary | ICD-10-CM | POA: Diagnosis not present

## 2023-12-27 NOTE — Telephone Encounter (Signed)
 Copied from CRM [id number]. Topic: Referral - Request for Referral >> Dec 27, 2023 11:28 AM [employer] wrote: Did the patient discuss referral with their provider in the last year? Yes (If No - schedule appointment) (If Yes - send message)  Appointment offered? No  Type of order/referral and detailed reason for visit: Behavioral health therapist  Preference of office, provider, location: [place] female  If referral order, have you been seen by this specialty before? No (If Yes, this issue or another issue? When? Where?  Can we respond through MyChart? Yes

## 2024-01-03 ENCOUNTER — Encounter: Payer: Self-pay | Admitting: Family Medicine

## 2024-01-03 ENCOUNTER — Ambulatory Visit (INDEPENDENT_AMBULATORY_CARE_PROVIDER_SITE_OTHER): Admitting: Family Medicine

## 2024-01-03 VITALS — BP 118/71 | HR 77 | Temp 98.4°F | Resp 18 | Ht 64.0 in | Wt 195.6 lb

## 2024-01-03 DIAGNOSIS — F418 Other specified anxiety disorders: Secondary | ICD-10-CM | POA: Diagnosis not present
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
## 2024-01-11 DIAGNOSIS — Z30432 Encounter for removal of intrauterine contraceptive device: Secondary | ICD-10-CM | POA: Diagnosis not present

## 2024-01-27 ENCOUNTER — Ambulatory Visit (INDEPENDENT_AMBULATORY_CARE_PROVIDER_SITE_OTHER): Admitting: Licensed Clinical Social Worker

## 2024-01-27 DIAGNOSIS — F4323 Adjustment disorder with mixed anxiety and depressed mood: Secondary | ICD-10-CM | POA: Diagnosis not present
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
## 2024-02-03 ENCOUNTER — Ambulatory Visit: Admitting: Licensed Clinical Social Worker

## 2024-02-08 ENCOUNTER — Encounter: Payer: Self-pay | Admitting: Licensed Clinical Social Worker

## 2024-02-08 ENCOUNTER — Ambulatory Visit (INDEPENDENT_AMBULATORY_CARE_PROVIDER_SITE_OTHER): Admitting: Licensed Clinical Social Worker

## 2024-02-08 DIAGNOSIS — F4323 Adjustment disorder with mixed anxiety and depressed mood: Secondary | ICD-10-CM | POA: Diagnosis not present
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
## 2024-02-15 DIAGNOSIS — G518 Other disorders of facial nerve: Secondary | ICD-10-CM | POA: Diagnosis not present

## 2024-02-15 DIAGNOSIS — M542 Cervicalgia: Secondary | ICD-10-CM | POA: Diagnosis not present

## 2024-02-15 DIAGNOSIS — G43719 Chronic migraine without aura, intractable, without status migrainosus: Secondary | ICD-10-CM | POA: Diagnosis not present

## 2024-02-15 DIAGNOSIS — M791 Myalgia, unspecified site: Secondary | ICD-10-CM | POA: Diagnosis not present

## 2024-02-28 ENCOUNTER — Ambulatory Visit: Payer: Self-pay

## 2024-02-28 DIAGNOSIS — R55 Syncope and collapse: Secondary | ICD-10-CM | POA: Diagnosis not present

## 2024-02-28 DIAGNOSIS — F419 Anxiety disorder, unspecified: Secondary | ICD-10-CM | POA: Diagnosis not present

## 2024-02-28 DIAGNOSIS — R109 Unspecified abdominal pain: Secondary | ICD-10-CM | POA: Diagnosis not present

## 2024-02-28 DIAGNOSIS — G43911 Migraine, unspecified, intractable, with status migrainosus: Secondary | ICD-10-CM | POA: Diagnosis not present

## 2024-02-28 NOTE — Telephone Encounter (Signed)
 FYI Only or Action Required?: FYI only for provider.  Patient was last seen in primary care on 01/03/2024 by Colette Torrence GRADE, MD.  Called Nurse Triage reporting Dizziness, Weakness, feeling weird, Headache, and chest heaviness.  Symptoms began several days ago.  Interventions attempted: Nothing.  Symptoms are: rapidly worsening.  Triage Disposition: Call EMS 911 Now  Patient/caregiver understands and will follow disposition?: Yes      Copied from CRM #[id number]. Topic: Clinical - Red Word Triage >> Feb 28, 2024 10:35 AM Chiquita SQUIBB wrote: Red Word that prompted transfer to Nurse Triage: Patient is calling in stating she has a severe headache, on and off dizziness, lightheaded, really weak, tired, and feeling weird. Reason for Disposition  Sounds like a life-threatening emergency to the triager  Answer Assessment - Initial Assessment Questions 2. ONSET: When did the headache start? (e.g., minutes, hours, days)      Kind of started little bit last week but really yesterday and little Saturday 3. PATTERN: Does the pain come and go, or has it been constant since it started?     Comes and goes but yesterday was lot more 4. SEVERITY: How bad is the pain? and What does it keep you from doing?  (e.g., Scale 1-10; mild, moderate, or severe)     Head hurts 9/10 right now 9. OTHER SYMPTOMS: Do you have any other symptoms? (e.g., fever, stiff neck, eye pain, sore throat, cold symptoms)     Chest feels heavy, comes and goes, feels like want to start coughing but have to keep clearing throat, weird feeling Not feeling like about to pass out right now but did earlier today  Protocols used: [facility]
